# Patient Record
Sex: Female | Born: 1960 | Race: Black or African American | Hispanic: No | Smoking: Former smoker
Health system: Southern US, Community
[De-identification: ages and names within clinical notes are randomized; demographics above are authoritative.]

## PROBLEM LIST (undated history)

## (undated) DIAGNOSIS — I10 Essential (primary) hypertension: Secondary | ICD-10-CM

## (undated) DIAGNOSIS — J45909 Unspecified asthma, uncomplicated: Secondary | ICD-10-CM

## (undated) DIAGNOSIS — H269 Unspecified cataract: Secondary | ICD-10-CM

## (undated) HISTORY — PX: APPENDECTOMY: SHX54

## (undated) HISTORY — DX: Unspecified cataract: H26.9

## (undated) HISTORY — PX: CATARACT EXTRACTION: SUR2

## (undated) HISTORY — DX: Essential (primary) hypertension: I10

---

## 2011-07-16 DIAGNOSIS — R42 Dizziness and giddiness: Secondary | ICD-10-CM | POA: Insufficient documentation

## 2011-07-16 DIAGNOSIS — R200 Anesthesia of skin: Secondary | ICD-10-CM | POA: Insufficient documentation

## 2011-07-16 DIAGNOSIS — R519 Headache, unspecified: Secondary | ICD-10-CM | POA: Insufficient documentation

## 2011-07-16 DIAGNOSIS — R531 Weakness: Secondary | ICD-10-CM | POA: Insufficient documentation

## 2011-07-16 DIAGNOSIS — R61 Generalized hyperhidrosis: Secondary | ICD-10-CM | POA: Insufficient documentation

## 2015-06-18 DIAGNOSIS — I10 Essential (primary) hypertension: Secondary | ICD-10-CM | POA: Insufficient documentation

## 2015-10-03 DIAGNOSIS — R748 Abnormal levels of other serum enzymes: Secondary | ICD-10-CM | POA: Insufficient documentation

## 2015-10-03 DIAGNOSIS — M67919 Unspecified disorder of synovium and tendon, unspecified shoulder: Secondary | ICD-10-CM | POA: Insufficient documentation

## 2015-10-03 DIAGNOSIS — M159 Polyosteoarthritis, unspecified: Secondary | ICD-10-CM | POA: Insufficient documentation

## 2015-10-29 DIAGNOSIS — J069 Acute upper respiratory infection, unspecified: Secondary | ICD-10-CM | POA: Insufficient documentation

## 2016-10-07 ENCOUNTER — Encounter (HOSPITAL_COMMUNITY): Payer: Self-pay | Admitting: Nurse Practitioner

## 2016-10-07 ENCOUNTER — Emergency Department (HOSPITAL_COMMUNITY)
Admission: EM | Admit: 2016-10-07 | Discharge: 2016-10-07 | Disposition: A | Discharge status: Home / Self Care | Payer: Self-pay | Attending: Emergency Medicine | Admitting: Emergency Medicine

## 2016-10-07 DIAGNOSIS — Y33XXXA Other specified events, undetermined intent, initial encounter: Secondary | ICD-10-CM | POA: Insufficient documentation

## 2016-10-07 DIAGNOSIS — Y998 Other external cause status: Secondary | ICD-10-CM | POA: Insufficient documentation

## 2016-10-07 DIAGNOSIS — S7011XA Contusion of right thigh, initial encounter: Secondary | ICD-10-CM | POA: Insufficient documentation

## 2016-10-07 DIAGNOSIS — T148XXA Other injury of unspecified body region, initial encounter: Secondary | ICD-10-CM

## 2016-10-07 DIAGNOSIS — S7012XA Contusion of left thigh, initial encounter: Secondary | ICD-10-CM | POA: Insufficient documentation

## 2016-10-07 DIAGNOSIS — Y929 Unspecified place or not applicable: Secondary | ICD-10-CM | POA: Insufficient documentation

## 2016-10-07 DIAGNOSIS — F17200 Nicotine dependence, unspecified, uncomplicated: Secondary | ICD-10-CM | POA: Insufficient documentation

## 2016-10-07 DIAGNOSIS — Y939 Activity, unspecified: Secondary | ICD-10-CM | POA: Insufficient documentation

## 2016-10-07 LAB — CBC WITH DIFFERENTIAL/PLATELET
Basophils Absolute: 0 10*3/uL (ref 0.0–0.1)
Basophils Relative: 0 %
Eosinophils Absolute: 0.1 10*3/uL (ref 0.0–0.7)
Eosinophils Relative: 1 %
HCT: 43.8 % (ref 36.0–46.0)
Hemoglobin: 14.6 g/dL (ref 12.0–15.0)
Lymphocytes Relative: 34 %
Lymphs Abs: 2.6 10*3/uL (ref 0.7–4.0)
MCH: 33 pg (ref 26.0–34.0)
MCHC: 33.3 g/dL (ref 30.0–36.0)
MCV: 98.9 fL (ref 78.0–100.0)
Monocytes Absolute: 0.4 10*3/uL (ref 0.1–1.0)
Monocytes Relative: 6 %
Neutro Abs: 4.5 10*3/uL (ref 1.7–7.7)
Neutrophils Relative %: 59 %
Platelets: 208 10*3/uL (ref 150–400)
RBC: 4.43 MIL/uL (ref 3.87–5.11)
RDW: 13.2 % (ref 11.5–15.5)
WBC: 7.6 10*3/uL (ref 4.0–10.5)

## 2016-10-07 LAB — COMPREHENSIVE METABOLIC PANEL
ALT: 13 U/L — ABNORMAL LOW (ref 14–54)
AST: 18 U/L (ref 15–41)
Albumin: 4.2 g/dL (ref 3.5–5.0)
Alkaline Phosphatase: 90 U/L (ref 38–126)
Anion gap: 8 (ref 5–15)
BUN: 11 mg/dL (ref 6–20)
CO2: 25 mmol/L (ref 22–32)
Calcium: 9.5 mg/dL (ref 8.9–10.3)
Chloride: 109 mmol/L (ref 101–111)
Creatinine, Ser: 1.05 mg/dL — ABNORMAL HIGH (ref 0.44–1.00)
GFR calc Af Amer: >60 mL/min (ref >60)
GFR calc non Af Amer: 59 mL/min — ABNORMAL LOW (ref >60)
Glucose, Bld: 95 mg/dL (ref 65–99)
Potassium: 3.9 mmol/L (ref 3.5–5.1)
Sodium: 142 mmol/L (ref 135–145)
Total Bilirubin: 0.8 mg/dL (ref 0.3–1.2)
Total Protein: 7.1 g/dL (ref 6.5–8.1)

## 2016-10-07 LAB — PROTIME-INR
INR: 0.97
Prothrombin Time: 12.8 seconds (ref 11.4–15.2)

## 2016-10-07 NOTE — ED Provider Notes (Signed)
[facility]-EMER[facility]ENCY DEPT Provider Note   CSN: [id number] Arrival [id number] & time: 10/07/16  [id number]     History   Chief Complaint No chief complaint on file.   HPI Audrey Montgomery is a 56 y.o. female.  HPI   56 year old female presents today with complaints of bruising to her bilateral lower extremities.  Patient notes over the last year she has had intermittent bruising different areas of her body.  She notes most recently several weeks ago she had bruising over her proximal thighs.  She notes these bruises have improved and only has minor discomfort and bruising presently.  Patient reports intermittent swelling of her lower extremities bilateral, none presently.  She denies any fever, weight loss, systemic symptoms.  Patient has not sought evaluation for this previously.  Patient recently moved to the area and does not have a primary care provider.  Patient denies any other bruising or bleeding, denies any bleeding disorders.  She does not take any antiplatelets, anticoagulants.  Occasional alcohol use.  Patient denies any chronic health conditions or any prescription medications.   History reviewed. No pertinent past medical history.  There are no active problems to display for this patient.   Past Surgical History:  Procedure Laterality Date  . APPENDECTOMY      OB History    No data available       Home Medications    Prior to Admission medications   Not on File    Family History No family history on file.  Social History Social History  Substance Use Topics  . Smoking status: Current Every Day Smoker    Packs/day: 0.50  . Smokeless tobacco: Never Used  . Alcohol use Yes     Allergies   Patient has no known allergies.   Review of Systems Review of Systems  All other systems reviewed and are negative.    Physical Exam Updated Vital Signs BP (!) 154/105   Pulse 70   Temp 98 F (36.7 C)   Resp 18   Ht 5\' 7"  (1.702 m)   Wt 81.6 kg (180 lb)   LMP  (LMP  Unknown)   SpO2 100%   BMI 28.19 kg/m   Physical Exam  Constitutional: She is oriented to person, place, and time. She appears well-developed and well-nourished.  HENT:  Head: Normocephalic and atraumatic.  Eyes: Pupils are equal, round, and reactive to light. Conjunctivae are normal. Right eye exhibits no discharge. Left eye exhibits no discharge. No scleral icterus.  Neck: Normal range of motion. No JVD present. No tracheal deviation present.  Pulmonary/Chest: Effort normal. No stridor.  Musculoskeletal:  Faint bruising to the bilateral upper thighs-minimally tender, bilateral lower extremities without swelling, redness or warmth to touch, distal perfusion is intact  Neurological: She is alert and oriented to person, place, and time. Coordination normal.  Psychiatric: She has a normal mood and affect. Her behavior is normal. Judgment and thought content normal.  Nursing note and vitals reviewed.    ED Treatments / Results  Labs (all labs ordered are listed, but only abnormal results are displayed) Labs Reviewed  COMPREHENSIVE METABOLIC PANEL - Abnormal; Notable for the following:       Result Value   Creatinine, Ser 1.05 (*)    ALT 13 (*)    GFR calc non Af Amer 59 (*)    All other components within normal limits  CBC WITH DIFFERENTIAL/PLATELET  PROTIME-INR    EKG  EKG Interpretation None  Radiology No results found.  Procedures Procedures (including critical care time)  Medications Ordered in ED Medications - No data to display   Initial Impression / Assessment and Plan / ED Course  I have reviewed the triage vital signs and the nursing notes.  Pertinent labs & imaging results that were available during my care of the patient were reviewed by me and considered in my medical decision making (see chart for details).      Final Clinical Impressions(s) / ED Diagnoses   Final diagnoses:  Bruising    56 year old female presents today with complaints  of bruising.  This appears to be localized on the proximal thighs.  I suspect this is likely secondary to light trauma and unlikely systemic reason.  Basic labs were performed here with no significant abnormalities.  Patient has no acute life-threatening etiology here, she will follow-up as an outpatient with her primary care, strict return precautions given.  She verbalized understanding and agreement to today's plan.  New Prescriptions New Prescriptions   No medications on file     Rosalio Loud 10/07/16 1334    Vanetta Mulders, MD 10/07/16 1714

## 2016-10-07 NOTE — ED Triage Notes (Signed)
Pt endorses bilateral leg pain intermittent ongoing for 2 weeks. Pt also notes discoloration on her legs that appeared to be bruised 2 weeks ago but has decreased since. Pt denies taking blood thinners, injury or falls. Pt smokes 1/2 pack daily, denies long travels, does not take bc

## 2016-10-07 NOTE — Discharge Instructions (Signed)
Please read attached information. If you experience any new or worsening signs or symptoms please return to the emergency room for evaluation. Please follow-up with your primary care provider or specialist as discussed.  °

## 2017-08-11 ENCOUNTER — Encounter (HOSPITAL_COMMUNITY): Payer: Self-pay | Admitting: Emergency Medicine

## 2017-08-11 ENCOUNTER — Other Ambulatory Visit: Payer: Self-pay

## 2017-08-11 ENCOUNTER — Emergency Department (HOSPITAL_COMMUNITY)
Admission: EM | Admit: 2017-08-11 | Discharge: 2017-08-11 | Disposition: A | Discharge status: Home / Self Care | Payer: Managed Care, Other (non HMO) | Attending: Emergency Medicine | Admitting: Emergency Medicine

## 2017-08-11 ENCOUNTER — Emergency Department (HOSPITAL_COMMUNITY): Payer: Managed Care, Other (non HMO)

## 2017-08-11 DIAGNOSIS — F17200 Nicotine dependence, unspecified, uncomplicated: Secondary | ICD-10-CM | POA: Diagnosis not present

## 2017-08-11 DIAGNOSIS — N12 Tubulo-interstitial nephritis, not specified as acute or chronic: Secondary | ICD-10-CM | POA: Diagnosis not present

## 2017-08-11 DIAGNOSIS — R509 Fever, unspecified: Secondary | ICD-10-CM | POA: Diagnosis present

## 2017-08-11 LAB — COMPREHENSIVE METABOLIC PANEL
ALBUMIN: 2.9 g/dL — AB (ref 3.5–5.0)
ALK PHOS: 164 U/L — AB (ref 38–126)
ALT: 62 U/L — ABNORMAL HIGH (ref 0–44)
AST: 51 U/L — AB (ref 15–41)
Anion gap: 8 (ref 5–15)
BILIRUBIN TOTAL: 0.8 mg/dL (ref 0.3–1.2)
BUN: 7 mg/dL (ref 6–20)
CALCIUM: 8.4 mg/dL — AB (ref 8.9–10.3)
CO2: 24 mmol/L (ref 22–32)
Chloride: 104 mmol/L (ref 98–111)
Creatinine, Ser: 1.16 mg/dL — ABNORMAL HIGH (ref 0.44–1.00)
GFR calc Af Amer: 60 mL/min — ABNORMAL LOW (ref >60)
GFR calc non Af Amer: 52 mL/min — ABNORMAL LOW (ref >60)
GLUCOSE: 128 mg/dL — AB (ref 70–99)
POTASSIUM: 3.3 mmol/L — AB (ref 3.5–5.1)
Sodium: 136 mmol/L (ref 135–145)
TOTAL PROTEIN: 6.5 g/dL (ref 6.5–8.1)

## 2017-08-11 LAB — CBC WITH DIFFERENTIAL/PLATELET
Abs Immature Granulocytes: 0.1 10*3/uL (ref 0.0–0.1)
Basophils Absolute: 0 10*3/uL (ref 0.0–0.1)
Basophils Relative: 0 %
Eosinophils Absolute: 0 10*3/uL (ref 0.0–0.7)
Eosinophils Relative: 0 %
HEMATOCRIT: 35.8 % — AB (ref 36.0–46.0)
HEMOGLOBIN: 11.6 g/dL — AB (ref 12.0–15.0)
IMMATURE GRANULOCYTES: 1 %
LYMPHS ABS: 2.2 10*3/uL (ref 0.7–4.0)
LYMPHS PCT: 18 %
MCH: 31.4 pg (ref 26.0–34.0)
MCHC: 32.4 g/dL (ref 30.0–36.0)
MCV: 97 fL (ref 78.0–100.0)
Monocytes Absolute: 1.6 10*3/uL — ABNORMAL HIGH (ref 0.1–1.0)
Monocytes Relative: 13 %
NEUTROS PCT: 68 %
Neutro Abs: 8.4 10*3/uL — ABNORMAL HIGH (ref 1.7–7.7)
Platelets: 272 10*3/uL (ref 150–400)
RBC: 3.69 MIL/uL — AB (ref 3.87–5.11)
RDW: 13 % (ref 11.5–15.5)
WBC: 12.3 10*3/uL — AB (ref 4.0–10.5)

## 2017-08-11 LAB — URINALYSIS, ROUTINE W REFLEX MICROSCOPIC
BILIRUBIN URINE: NEGATIVE
GLUCOSE, UA: 150 mg/dL — AB
Ketones, ur: NEGATIVE mg/dL
Nitrite: POSITIVE — AB
PH: 6 (ref 5.0–8.0)
Protein, ur: 30 mg/dL — AB
SPECIFIC GRAVITY, URINE: 1.011 (ref 1.005–1.030)
WBC, UA: >50 WBC/hpf — ABNORMAL HIGH (ref 0–5)

## 2017-08-11 LAB — I-STAT CG4 LACTIC ACID, ED: Lactic Acid, Venous: 1.36 mmol/L (ref 0.5–1.9)

## 2017-08-11 MED ORDER — ACETAMINOPHEN 325 MG PO TABS
650.0000 mg | ORAL_TABLET | Freq: Once | ORAL | Status: AC
  Administered 2017-08-11: 650 mg via ORAL
  Filled 2017-08-11: qty 2

## 2017-08-11 MED ORDER — SODIUM CHLORIDE 0.9 % IV SOLN
1.0000 g | Freq: Once | INTRAVENOUS | Status: AC
  Administered 2017-08-11: 1 g via INTRAVENOUS
  Filled 2017-08-11: qty 10

## 2017-08-11 MED ORDER — CEPHALEXIN 500 MG PO CAPS: 500.0000 mg | ORAL_CAPSULE | Freq: Three times a day (TID) | ORAL | 0 refills | Status: DC

## 2017-08-11 NOTE — ED Triage Notes (Signed)
Per pt, pt coming from home with complaints of running fevers for two days. Pt complains of cold sweats and being constatly tired. Pt denies N/V/D or any URI symptoms. Temp 100.9 F

## 2017-08-11 NOTE — ED Provider Notes (Signed)
[facility] EMERGENCY [facility] Provider Note   CSN: [id number] Arrival date & time: 08/11/17  0431     History   Chief Complaint Chief Complaint  Patient presents with  . Fever    HPI Audrey Montgomery is a 57 y.o. female.  HPI  This is a 57 year old female who presents with fever.  Patient reports 2-day history of running fevers at home.  Reports oral temperatures at home as high as 107.5.  She reports taking Tylenol with some improvement but fever returns.  She reports myalgias and cold sweats.  Denies any cough, chest pain, shortness of breath, nausea, vomiting, diarrhea.  She denies any upper respiratory symptoms including sore throat, ear pain.  She denies rash or dysuria.  She denies any other infectious symptoms.  She states "I just do not feel well."  Does report some headache.  Denies neck pain or stiffness.  Denies any tick exposures.  History reviewed. No pertinent past medical history.  There are no active problems to display for this patient.   Past Surgical History:  Procedure Laterality Date  . APPENDECTOMY       OB History   None      Home Medications    Prior to Admission medications   Medication Sig Start Date End Date Taking? Authorizing Provider  cephALEXin (KEFLEX) 500 MG capsule Take 1 capsule (500 mg total) by mouth 3 (three) times daily. 08/11/17   Kimberlie Csaszar, Mayer Maskerourtney F, MD    Family History History reviewed. No pertinent family history.  Social History Social History   Tobacco Use  . Smoking status: Current Every Day Smoker    Packs/day: 0.50  . Smokeless tobacco: Never Used  Substance Use Topics  . Alcohol use: Yes  . Drug use: No     Allergies   Patient has no known allergies.   Review of Systems Review of Systems  Constitutional: Positive for chills and fever.  HENT: Negative for congestion and sore throat.   Respiratory: Negative for cough and shortness of breath.   Cardiovascular: Negative for chest  pain.  Gastrointestinal: Negative for abdominal pain, diarrhea, nausea and vomiting.  Genitourinary: Negative for dysuria.  Musculoskeletal: Negative for back pain, neck pain and neck stiffness.  Skin: Negative for rash.  Neurological: Negative for weakness and headaches.  All other systems reviewed and are negative.    Physical Exam Updated Vital Signs BP 113/74   Pulse 79   Temp 100.3 F (37.9 C) (Oral)   Ht 5\' 8"  (1.727 m)   Wt 70.3 kg (155 lb)   SpO2 96%   BMI 23.57 kg/m   Physical Exam  Constitutional: She is oriented to person, place, and time. She appears well-developed and well-nourished. She appears distressed.  HENT:  Head: Normocephalic and atraumatic.  Mouth/Throat: Oropharynx is clear and moist. No oropharyngeal exudate.  Eyes: Pupils are equal, round, and reactive to light.  Neck: Normal range of motion. Neck supple.  No meningismus  Cardiovascular: Normal rate, regular rhythm and normal heart sounds.  Pulmonary/Chest: Effort normal and breath sounds normal. No respiratory distress. She has no wheezes.  Abdominal: Soft. Bowel sounds are normal. There is no tenderness.  Musculoskeletal: She exhibits no edema or deformity.  Neurological: She is alert and oriented to person, place, and time.  Skin: Skin is warm and dry. No rash noted.  Psychiatric: She has a normal mood and affect.  Nursing note and vitals reviewed.    ED Treatments / Results  Labs (all  labs ordered are listed, but only abnormal results are displayed) Labs Reviewed  CBC WITH DIFFERENTIAL/PLATELET - Abnormal; Notable for the following components:      Result Value   WBC 12.3 (*)    RBC 3.69 (*)    Hemoglobin 11.6 (*)    HCT 35.8 (*)    Neutro Abs 8.4 (*)    Monocytes Absolute 1.6 (*)    All other components within normal limits  COMPREHENSIVE METABOLIC PANEL - Abnormal; Notable for the following components:   Potassium 3.3 (*)    Glucose, Bld 128 (*)    Creatinine, Ser 1.16 (*)     Calcium 8.4 (*)    Albumin 2.9 (*)    AST 51 (*)    ALT 62 (*)    Alkaline Phosphatase 164 (*)    GFR calc non Af Amer 52 (*)    GFR calc Af Amer 60 (*)    All other components within normal limits  URINALYSIS, ROUTINE W REFLEX MICROSCOPIC - Abnormal; Notable for the following components:   APPearance HAZY (*)    Glucose, UA 150 (*)    Hgb urine dipstick MODERATE (*)    Protein, ur 30 (*)    Nitrite POSITIVE (*)    Leukocytes, UA MODERATE (*)    WBC, UA >50 (*)    Bacteria, UA MANY (*)    All other components within normal limits  URINE CULTURE  I-STAT CG4 LACTIC ACID, ED  I-STAT CG4 LACTIC ACID, ED    EKG None  Radiology Dg Chest 2 View  Result Date: 08/11/2017 CLINICAL DATA:  Fever for 2 days. EXAM: CHEST - 2 VIEW COMPARISON:  None. FINDINGS: The heart size and mediastinal contours are within normal limits. Both lungs are clear. The visualized skeletal structures are unremarkable. IMPRESSION: No active cardiopulmonary disease. Electronically Signed   By: Burman Nieves M.D.   On: 08/11/2017 05:43    Procedures Procedures (including critical care time)  Medications Ordered in ED Medications  acetaminophen (TYLENOL) tablet 650 mg (650 mg Oral Given 08/11/17 0631)  cefTRIAXone (ROCEPHIN) 1 g in sodium chloride 0.9 % 100 mL IVPB (0 g Intravenous Stopped 08/11/17 0702)     Initial Impression / Assessment and Plan / ED Course  I have reviewed the triage vital signs and the nursing notes.  Pertinent labs & imaging results that were available during my care of the patient were reviewed by me and considered in my medical decision making (see chart for details).  Clinical Course as of Aug 12 703  Wed Aug 11, 2017  1610 Patient feels better after Tylenol and antibiotics.  She is overall nontoxic-appearing.  Vital signs remain reassuring.  Urine culture sent.   [CH]    Clinical Course User Index [CH] Davyn Morandi, Mayer Masker, MD    Patient presents with fever.  Denies any other  significant symptoms.  Does report myalgias.  She is overall nontoxic-appearing.  Initial temperature 100.9.  Mildly tachycardic.  Lactate is normal.  She systemically does not appear ill-appearing.  She is a mild leukocytosis.  Chest x-ray is negative.  Urinalysis is nitrite positive with greater than 50 white cells and many bacteria.  Suspect this is likely the source.  Given her fever, this is more likely an upper urinary tract infection.  On recheck, patient states she feels much better after Tylenol and antibiotics.  Feel she is an appropriate candidate for outpatient therapy.  She is tolerating fluids without difficulty.  Will discharge home with  Keflex 3 times daily.  She was given strict return precautions.  After history, exam, and medical workup I feel the patient has been appropriately medically screened and is safe for discharge home. Pertinent diagnoses were discussed with the patient. Patient was given return precautions.   Final Clinical Impressions(s) / ED Diagnoses   Final diagnoses:  Pyelonephritis    ED Discharge Orders        Ordered    cephALEXin (KEFLEX) 500 MG capsule  3 times daily     08/11/17 0705       Crystalee Ventress, Mayer Masker, MD 08/11/17 [phone]

## 2017-08-11 NOTE — Discharge Instructions (Addendum)
You were seen today for fever.  You were found to have a urinary tract infection.  Take antibiotics as prescribed.  If you have persistent fevers, develop back pain or any worsening symptoms you should be reevaluated immediately.  Make sure to stay hydrated.

## 2017-08-13 LAB — URINE CULTURE: Culture: >=100000 — AB

## 2017-08-14 ENCOUNTER — Telehealth: Payer: Self-pay

## 2017-08-14 NOTE — Telephone Encounter (Signed)
Post ED Visit - Positive Culture Follow-up: Successful Patient Follow-Up  Culture assessed and recommendations reviewed by:  []  Enzo BiNathan Batchelder, Pharm.D. []  Celedonio MiyamotoJeremy Frens, Pharm.D., BCPS AQ-ID [x]  Garvin FilaMike Maccia, Pharm.D., BCPS []  Georgina PillionElizabeth Martin, Pharm.D., BCPS []  AmherstMinh Pham, Vermont[place]arm.D., BCPS, AAHIVP []  Estella HuskMichelle Turner, Pharm.D., BCPS, AAHIVP []  Lysle Pearlachel Rumbarger, PharmD, BCPS []  Phillips Climeshuy Dang, PharmD, BCPS []  Agapito GamesAlison Masters, PharmD, BCPS []  Verlan FriendsErin Deja, PharmD  Positive urine culture  []  Patient discharged without antimicrobial prescription and treatment is now indicated [x]  Organism is resistant to prescribed ED discharge antimicrobial []  Patient with positive blood cultures  Changes discussed with ED provider: Shirlyn GoltzEmily Shrosbree PA-C New antibiotic prescription Bactrim DS 1 BID x 7 days [facility] [phone]  Contacted patient, date 08/14/17, time 1009   Marriana Hibberd Doristine CounterBurnett 08/14/2017, 10:05 AM

## 2017-08-14 NOTE — Progress Notes (Signed)
ED Antimicrobial Stewardship Positive Culture Follow Up   Audrey Montgomery is an 57 y.o. female who presented to [facility] 08/11/2017 with a chief complaint of  Chief Complaint  Patient presents with  . Fever    Recent Results (from the past 720 hour(s))  Urine culture     Status: Abnormal   Collection Time: 08/11/17  6:13 AM  Result Value Ref Range Status   Specimen Description URINE, RANDOM  Final   Special Requests NONE  Final   Culture (A)  Final    >=100,000 COLONIES/mL ESCHERICHIA COLI Confirmed Extended Spectrum Beta-Lactamase Producer (ESBL).  In bloodstream infections from ESBL organisms, carbapenems are preferred over piperacillin/tazobactam. They are shown to have a lower risk of mortality. Performed at [facility], [address] [place], [place] [address]    Report Status 08/13/2017 FINAL  Final   Organism ID, Bacteria ESCHERICHIA COLI (A)  Final      Susceptibility   Escherichia coli - MIC*    AMPICILLIN >=32 RESISTANT Resistant     CEFAZOLIN >=64 RESISTANT Resistant     CEFTRIAXONE >=64 RESISTANT Resistant     CIPROFLOXACIN >=4 RESISTANT Resistant     GENTAMICIN <=1 SENSITIVE Sensitive     IMIPENEM <=0.25 SENSITIVE Sensitive     NITROFURANTOIN <=16 SENSITIVE Sensitive     TRIMETH/SULFA <=20 SENSITIVE Sensitive     AMPICILLIN/SULBACTAM 16 INTERMEDIATE Intermediate     PIP/TAZO <=4 SENSITIVE Sensitive     Extended ESBL POSITIVE Resistant     * >=100,000 COLONIES/mL ESCHERICHIA COLI    [x]  Treated with cephalexin, organism resistant to prescribed antimicrobial []  Patient discharged originally without antimicrobial agent and treatment is now indicated  New antibiotic prescription: bactrim ds 1 bid x 1 week  ED Provider: Shirlyn GoltzEmily Shrosbree, PA-C  Isaac BlissMichael Bena Kobel, PharmD, BCPS, BCCCP Clinical Pharmacist [phone]  Please check AMION for all [facility] numbers  08/14/2017 9:06 AM

## 2017-11-23 ENCOUNTER — Ambulatory Visit: Payer: Managed Care, Other (non HMO) | Attending: Nurse Practitioner | Admitting: Nurse Practitioner

## 2017-11-23 ENCOUNTER — Encounter: Payer: Self-pay | Admitting: Nurse Practitioner

## 2017-11-23 VITALS — BP 157/104 | HR 68 | Temp 98.7°F | Ht 70.0 in | Wt 163.6 lb

## 2017-11-23 DIAGNOSIS — Z1211 Encounter for screening for malignant neoplasm of colon: Secondary | ICD-10-CM | POA: Diagnosis not present

## 2017-11-23 DIAGNOSIS — F17200 Nicotine dependence, unspecified, uncomplicated: Secondary | ICD-10-CM | POA: Diagnosis not present

## 2017-11-23 DIAGNOSIS — Z1231 Encounter for screening mammogram for malignant neoplasm of breast: Secondary | ICD-10-CM | POA: Diagnosis not present

## 2017-11-23 DIAGNOSIS — I10 Essential (primary) hypertension: Secondary | ICD-10-CM

## 2017-11-23 DIAGNOSIS — M79606 Pain in leg, unspecified: Secondary | ICD-10-CM | POA: Insufficient documentation

## 2017-11-23 DIAGNOSIS — F17210 Nicotine dependence, cigarettes, uncomplicated: Secondary | ICD-10-CM | POA: Insufficient documentation

## 2017-11-23 DIAGNOSIS — Z716 Tobacco abuse counseling: Secondary | ICD-10-CM | POA: Insufficient documentation

## 2017-11-23 DIAGNOSIS — Z0000 Encounter for general adult medical examination without abnormal findings: Secondary | ICD-10-CM | POA: Insufficient documentation

## 2017-11-23 DIAGNOSIS — R51 Headache: Secondary | ICD-10-CM | POA: Insufficient documentation

## 2017-11-23 MED ORDER — VARENICLINE TARTRATE 0.5 MG X 11 & 1 MG X 42 PO MISC
ORAL | 0 refills | Status: DC
Start: 2017-11-23 — End: 2017-12-30

## 2017-11-23 MED ORDER — CLONIDINE HCL 0.1 MG PO TABS
0.2000 mg | ORAL_TABLET | Freq: Once | ORAL | Status: AC
  Administered 2017-11-23: 0.2 mg via ORAL

## 2017-11-23 MED ORDER — AMLODIPINE BESYLATE 5 MG PO TABS: 5.0000 mg | ORAL_TABLET | Freq: Every day | ORAL | 3 refills | Status: DC

## 2017-11-23 MED FILL — AMLODIPINE BESYLATE 5 MG TA: 5 | 30 days supply | Qty: 30 | Fill #0

## 2017-11-23 NOTE — Progress Notes (Signed)
Assessment & Plan:  Audrey Montgomery was seen today for establish care.  Diagnoses and all orders for this visit:  Essential hypertension -     cloNIDine (CATAPRES) tablet 0.2 mg -     amLODipine (NORVASC) 5 MG tablet; Take 1 tablet (5 mg total) by mouth daily. -     CBC -     CMP14+EGFR Continue all antihypertensives as prescribed.  Remember to bring in your blood pressure log with you for your follow up appointment.  DASH/Mediterranean Diets are healthier choices for HTN.   Tobacco dependence -     varenicline (CHANTIX STARTING MONTH PAK) 0.5 MG X 11 & 1 MG X 42 tablet; Take one 0.5 mg tablet by mouth daily x 3 days,  increase to one 0.5 mg tablet twice daily x 4 days,  increase to one 1 mg tablet 2x daily Audrey Montgomery was counseled on the dangers of tobacco use, and was advised to quit. Reviewed strategies to maximize success, including removing cigarettes and smoking materials from environment, stress management and support of family/friends as well as pharmacological alternatives including: Wellbutrin, Chantix, Nicotine patch, Nicotine gum or lozenges. Smoking cessation support: smoking cessation hotline: 1-800-QUIT-NOW.  Smoking cessation classes are also available through [facility]. Call ([phone] or visit our website at [email or web address]   A total of 5 minutes was spent on counseling for smoking cessation and Audrey Montgomery is ready to quit.   Routine health maintenance -     Hepatitis C Antibody  Breast cancer screening by mammogram -     MM 3D SCREEN BREAST BILATERAL; Future  Colon cancer screening -     Fecal occult blood, imunochemical([facility])    Patient has been counseled on age-appropriate routine health concerns for screening and prevention. These are reviewed and up-to-date. Referrals have been placed accordingly. Immunizations are up-to-date or declined.    Subjective:   Chief Complaint  Patient presents with  . Establish Care   Pt. is here to establish care. Pt. stated she have headaches and leg pain.    HPI Audrey Montgomery 57 y.o. female presents to office today to establish care as a new patient.  Her blood pressure is elevated today. She required clonidine here in the office today.     Essential Hypertension Started on amlodipine 5 mg today. She denies any previous history of HTN.  Denies chest pain, shortness of breath, palpitations, lightheadedness, dizziness, headaches or BLE edema.  BP Readings from Last 3 Encounters:  11/23/17 (!) 157/104  08/11/17 113/74  10/07/16 (!) 154/105    Tobacco Dependence She is ready to quit smoking. Would like to try chantix. Currently smoking 1/2 PPD.      Review of Systems  Constitutional: Negative for fever, malaise/fatigue and weight loss.  HENT: Negative.  Negative for nosebleeds.   Eyes: Positive for blurred vision. Negative for double vision and photophobia.  Respiratory: Negative.  Negative for cough and shortness of breath.   Cardiovascular: Negative.  Negative for chest pain, palpitations and leg swelling.  Gastrointestinal: Negative.  Negative for heartburn, nausea and vomiting.  Musculoskeletal: Negative.  Negative for myalgias.  Neurological: Negative.  Negative for dizziness, focal weakness, seizures and headaches.  Psychiatric/Behavioral: Negative.  Negative for suicidal ideas.    Past Medical History:  Diagnosis Date  . Cataract    Both Eyes.  . Hypertension     Past Surgical History:  Procedure Laterality Date  . APPENDECTOMY      Family History  Problem Relation Age  of Onset  . Stroke Brother     Social History Reviewed with no changes to be made today.   Outpatient Medications Prior to Visit  Medication Sig Dispense Refill  . cephALEXin (KEFLEX) 500 MG capsule Take 1 capsule (500 mg total) by mouth 3 (three) times daily. (Patient not taking: Reported on 11/23/2017) 21 capsule 0   No facility-administered medications prior to  visit.     No Known Allergies     Objective:    BP (!) 157/104 (BP Location: Right Arm, Patient Position: Sitting, Cuff Size: Normal)   Pulse 68   Temp 98.7 F (37.1 C) (Oral)   Ht '5\' 10"'  (1.778 m)   Wt 163 lb 9.6 oz (74.2 kg)   LMP  (LMP Unknown)   SpO2 100%   BMI 23.47 kg/m  Wt Readings from Last 3 Encounters:  11/23/17 163 lb 9.6 oz (74.2 kg)  08/11/17 155 lb (70.3 kg)  10/07/16 180 lb (81.6 kg)    Physical Exam  Constitutional: She is oriented to person, place, and time. She appears well-developed and well-nourished. She is cooperative.  HENT:  Head: Normocephalic and atraumatic.  Eyes: EOM are normal.  Neck: Normal range of motion.  Cardiovascular: Normal rate, regular rhythm and normal heart sounds. Exam reveals no gallop and no friction rub.  No murmur heard. Pulmonary/Chest: Effort normal and breath sounds normal. No tachypnea. No respiratory distress. She has no decreased breath sounds. She has no wheezes. She has no rhonchi. She has no rales. She exhibits no tenderness.  Abdominal: Bowel sounds are normal.  Musculoskeletal: Normal range of motion. She exhibits no edema.  Neurological: She is alert and oriented to person, place, and time. Coordination normal.  Skin: Skin is warm and dry.  Psychiatric: She has a normal mood and affect. Her behavior is normal. Judgment and thought content normal.  Nursing note and vitals reviewed.      Patient has been counseled extensively about nutrition and exercise as well as the importance of adherence with medications and regular follow-up. The patient was given clear instructions to go to ER or return to medical center if symptoms don't improve, worsen or new problems develop. The patient verbalized understanding.   Follow-up: Return in about 3 weeks (around 12/14/2017) for BP recheck with luke and schedule ABI with nubea same day. See me in 2 months.   [name], FNP-BC [facility] [place], [place]   11/24/2017, 11:36 PM

## 2017-11-24 ENCOUNTER — Encounter: Payer: Self-pay | Admitting: Nurse Practitioner

## 2017-11-24 LAB — CBC
Hematocrit: 43.8 % (ref 34.0–46.6)
Hemoglobin: 14.7 g/dL (ref 11.1–15.9)
MCH: 32.6 pg (ref 26.6–33.0)
MCHC: 33.6 g/dL (ref 31.5–35.7)
MCV: 97 fL (ref 79–97)
PLATELETS: 224 10*3/uL (ref 150–450)
RBC: 4.51 x10E6/uL (ref 3.77–5.28)
RDW: 12 % — ABNORMAL LOW (ref 12.3–15.4)
WBC: 7.2 10*3/uL (ref 3.4–10.8)

## 2017-11-24 LAB — CMP14+EGFR
A/G RATIO: 1.8 (ref 1.2–2.2)
ALT: 12 IU/L (ref 0–32)
AST: 19 IU/L (ref 0–40)
Albumin: 4.4 g/dL (ref 3.5–5.5)
Alkaline Phosphatase: 88 IU/L (ref 39–117)
BUN/Creatinine Ratio: 12 (ref 9–23)
BUN: 13 mg/dL (ref 6–24)
Bilirubin Total: 0.4 mg/dL (ref 0.0–1.2)
CALCIUM: 9.8 mg/dL (ref 8.7–10.2)
CO2: 24 mmol/L (ref 20–29)
CREATININE: 1.06 mg/dL — AB (ref 0.57–1.00)
Chloride: 104 mmol/L (ref 96–106)
GFR calc Af Amer: 67 mL/min/{1.73_m2} (ref >59)
GFR, EST NON AFRICAN AMERICAN: 58 mL/min/{1.73_m2} — AB (ref >59)
GLUCOSE: 81 mg/dL (ref 65–99)
Globulin, Total: 2.5 g/dL (ref 1.5–4.5)
Potassium: 4.2 mmol/L (ref 3.5–5.2)
Sodium: 142 mmol/L (ref 134–144)
TOTAL PROTEIN: 6.9 g/dL (ref 6.0–8.5)

## 2017-11-24 LAB — HEPATITIS C ANTIBODY: Hep C Virus Ab: <0.1 s/co ratio (ref 0.0–0.9)

## 2017-11-24 MED FILL — CHANTIX STARTING MONTH BOX: 0.5 MG X 11 | 30 days supply | Qty: 53 | Fill #0

## 2017-11-25 ENCOUNTER — Telehealth: Payer: Self-pay

## 2017-11-25 NOTE — Telephone Encounter (Signed)
-----   Message from Claiborne Rigg, NP sent at 11/24/2017 11:41 PM EDT ----- HEP C is negative. All of your labs are essentially normal. There are some minor variations in your blood work that do not require any additional work up at this time. Will continue to monitor. Make sure you are drinking at least 48 oz of water per day. Work on eating a low fat, heart healthy diet and participate in regular aerobic exercise program to control as well. Exercise at least  30 minutes per day-5 days per week. Avoid red meat. No fried foods. No junk foods, sodas, sugary foods or drinks, unhealthy snacking, alcohol or smoking.

## 2017-11-25 NOTE — Telephone Encounter (Signed)
CMA attempt to reach patient to inform on results.  No answer and left a VM for patient to call back.  If patient call back, please inform:  HEP C is negative. All of your labs are essentially normal. There are some minor variations in your blood work that do not require any additional work up at this time. Will continue to monitor. Make sure you are drinking at least 48 oz of water per day. Work on eating a low fat, heart healthy diet and participate in regular aerobic exercise program to control as well. Exercise at least  30 minutes per day-5 days per week. Avoid red meat. No fried foods. No junk foods, sodas, sugary foods or drinks, unhealthy snacking, alcohol or smoking.

## 2017-12-02 LAB — FECAL OCCULT BLOOD, IMMUNOCHEMICAL: FECAL OCCULT BLD: NEGATIVE

## 2017-12-07 ENCOUNTER — Ambulatory Visit (INDEPENDENT_AMBULATORY_CARE_PROVIDER_SITE_OTHER): Payer: Managed Care, Other (non HMO) | Admitting: Physician Assistant

## 2017-12-07 ENCOUNTER — Telehealth: Payer: Self-pay

## 2017-12-07 NOTE — Telephone Encounter (Signed)
CMA attempt to reach patient to inform on results.  No answer and unable to leave a VM due to mailbox is full.  If patient call back, please inform:  Stool sample negative for blood

## 2017-12-07 NOTE — Telephone Encounter (Signed)
-----   Message from Claiborne Rigg, NP sent at 12/05/2017  9:25 PM EDT ----- Stool sample negative for blood

## 2017-12-14 ENCOUNTER — Other Ambulatory Visit: Payer: Self-pay | Admitting: Nurse Practitioner

## 2017-12-14 ENCOUNTER — Ambulatory Visit: Payer: Managed Care, Other (non HMO) | Attending: Family Medicine | Admitting: Pharmacist

## 2017-12-14 ENCOUNTER — Ambulatory Visit: Payer: Managed Care, Other (non HMO) | Admitting: *Deleted

## 2017-12-14 ENCOUNTER — Encounter: Payer: Self-pay | Admitting: Pharmacist

## 2017-12-14 VITALS — BP 129/86 | HR 66

## 2017-12-14 DIAGNOSIS — M79604 Pain in right leg: Secondary | ICD-10-CM

## 2017-12-14 DIAGNOSIS — M79605 Pain in left leg: Principal | ICD-10-CM

## 2017-12-14 DIAGNOSIS — Z79899 Other long term (current) drug therapy: Secondary | ICD-10-CM | POA: Insufficient documentation

## 2017-12-14 DIAGNOSIS — I10 Essential (primary) hypertension: Secondary | ICD-10-CM | POA: Insufficient documentation

## 2017-12-14 DIAGNOSIS — Z823 Family history of stroke: Secondary | ICD-10-CM | POA: Insufficient documentation

## 2017-12-14 DIAGNOSIS — F17210 Nicotine dependence, cigarettes, uncomplicated: Secondary | ICD-10-CM | POA: Insufficient documentation

## 2017-12-14 LAB — POCT ABI - SCREENING FOR PILOT NO CHARGE
Immediate ABI left: 1.14
Immediate ABI right: 1.17

## 2017-12-14 MED ORDER — IBUPROFEN 800 MG PO TABS: 800.0000 mg | ORAL_TABLET | Freq: Three times a day (TID) | ORAL | 1 refills | Status: DC | PRN

## 2017-12-14 NOTE — Progress Notes (Signed)
   S:    PCP: Bertram Denver  Patient arrives in good spirits. Presents to the clinic for hypertension management. Patient was referred by Zelda on 11/23/17. Initial BP at that visit was 176/118. Clonidine 0.2 mgx1 administered in clinic. BP decreased to 157/104. Amlodipine 5 mg initiated.   Patient denies CP, SOB, HA or blurred vision.    Patient reports adherence with medications.  Current BP Medications include:   - amlodipine 5 mg  Antihypertensives tried in the past include:  - NKDA - no other medications tried in the past  Dietary habits include:  - Limits salt - 1 cup of coffee/dy Exercise habits include: - On her feet all day at work Family / Social history:  - FH: stroke (brother) - Tobacco: currently on Chantix, reports only smoking 1 cigarette today - Alcohol: occasional  Home BP readings:  - Does not check at home  O:  L arm after 5 minutes rest: 129/86, HR 66  Last 3 Office BP readings: BP Readings from Last 3 Encounters:  11/23/17 (!) 157/104  08/11/17 113/74  10/07/16 (!) 154/105   BMET    Component Value Date/Time   NA 142 11/23/2017 1526   K 4.2 11/23/2017 1526   CL 104 11/23/2017 1526   CO2 24 11/23/2017 1526   GLUCOSE 81 11/23/2017 1526   GLUCOSE 128 (H) 08/11/2017 0522   BUN 13 11/23/2017 1526   CREATININE 1.06 (H) 11/23/2017 1526   CALCIUM 9.8 11/23/2017 1526   GFRNONAA 58 (L) 11/23/2017 1526   GFRAA 67 11/23/2017 1526   Renal function: CrCl cannot be calculated (Unknown ideal weight.).  A/P: Hypertension newly diagnosed currently uncontrolled but improved on current medications. BP Goal <130/80 mmHg. Patient is adherent with amlodipine. She is uninterested in increasing dose at this time. She will follow-up with me in 2 weeks. If she continues to be above goal, she is agreeable to increasing amlodipine at that time. -Continued amlodipine 5 mg. -Counseled on lifestyle modifications for blood pressure control including reduced dietary  sodium, increased exercise, adequate sleep  Results reviewed and written information provided.  Total time in face-to-face counseling 15 minutes.   F/U Clinic Visit in 2 weeks.    Butch Penny, PharmD, CPP Clinical Pharmacist [facility] [phone]

## 2017-12-14 NOTE — Patient Instructions (Signed)
Thank you for coming to see us today.   Blood pressure today is improved.   Continue taking blood pressure medications as prescribed.   Limiting salt and caffeine, as well as exercising as able for at least 30 minutes for 5 days out of the week, can also help you lower your blood pressure.  Take your blood pressure at home if you are able. Please write down these numbers and bring them to your visits.  If you have any questions about medications, please call me [phone].  Audrey Montgomery  

## 2017-12-20 ENCOUNTER — Telehealth: Payer: Self-pay

## 2017-12-20 NOTE — Telephone Encounter (Signed)
CMA attempt to reach patient to inform on results  No answer and unable to leave a VM due to mailbox is full.  A letter will be send out to reach patient.  

## 2017-12-20 NOTE — Telephone Encounter (Signed)
-----   Message from Claiborne Rigg, NP sent at 12/15/2017  9:02 PM EST ----- Arterial study does not show any peripheral artery disease.

## 2017-12-29 ENCOUNTER — Ambulatory Visit: Payer: Managed Care, Other (non HMO) | Admitting: Pharmacist

## 2017-12-30 ENCOUNTER — Encounter: Payer: Self-pay | Admitting: Pharmacist

## 2017-12-30 ENCOUNTER — Ambulatory Visit: Payer: Managed Care, Other (non HMO) | Attending: Family Medicine | Admitting: Pharmacist

## 2017-12-30 VITALS — BP 155/83 | HR 68

## 2017-12-30 DIAGNOSIS — F17210 Nicotine dependence, cigarettes, uncomplicated: Secondary | ICD-10-CM | POA: Insufficient documentation

## 2017-12-30 DIAGNOSIS — I10 Essential (primary) hypertension: Secondary | ICD-10-CM | POA: Insufficient documentation

## 2017-12-30 DIAGNOSIS — Z79899 Other long term (current) drug therapy: Secondary | ICD-10-CM | POA: Insufficient documentation

## 2017-12-30 MED ORDER — VARENICLINE TARTRATE 1 MG PO TABS: 1.0000 mg | ORAL_TABLET | Freq: Two times a day (BID) | ORAL | 2 refills | Status: DC

## 2017-12-30 MED ORDER — AMLODIPINE BESYLATE 10 MG PO TABS: 10.0000 mg | ORAL_TABLET | Freq: Every day | ORAL | 0 refills | Status: DC

## 2017-12-30 NOTE — Progress Notes (Signed)
   S:    PCP: Bertram DenverZelda Fleming  Patient arrives in good spirits. Presents to the clinic for hypertension management. Patient was referred by Zelda on 11/23/17. Initial BP at that visit was 176/118. Clonidine 0.2 mgx1 administered in clinic. BP decreased to 157/104. Amlodipine 5 mg initiated.   Patient denies CP, SOB, HA or blurred vision.    Patient reports adherence with medications.  Current BP Medications include:   - amlodipine 5 mg  Antihypertensives tried in the past include:  - NKDA - no other medications tried in the past  Dietary habits include:  - Limits salt - 1 cup of coffee/day Exercise habits include: - On her feet all day at work Family / Social history:  - FH: stroke (brother) - Tobacco: currently on Chantix, reports only smoking 1-2 cigarettes a day - Alcohol: occasional  Home BP readings:  - Does not check at home  O:  L arm after 5 minutes rest: 155/83, HR 83  Last 3 Office BP readings: BP Readings from Last 3 Encounters:  12/14/17 129/86  11/23/17 (!) 157/104  08/11/17 113/74   BMET    Component Value Date/Time   NA 142 11/23/2017 1526   K 4.2 11/23/2017 1526   CL 104 11/23/2017 1526   CO2 24 11/23/2017 1526   GLUCOSE 81 11/23/2017 1526   GLUCOSE 128 (H) 08/11/2017 0522   BUN 13 11/23/2017 1526   CREATININE 1.06 (H) 11/23/2017 1526   CALCIUM 9.8 11/23/2017 1526   GFRNONAA 58 (L) 11/23/2017 1526   GFRAA 67 11/23/2017 1526   Renal function: CrCl cannot be calculated (Patient's most recent lab result is older than the maximum 21 days allowed.).  A/P: Hypertension longstanding currently uncontrolled on current medications. BP Goal <130/80 mmHg. Patient is adherent with amlodipine. She states that she has been under a lot of stress lately. Of note, pt has completed her starting month of Chantix and is requesting her continuing month (1 mg BID). -Increased amlodipine to 10 mg. -Chantix ordered -Lipid -Counseled on lifestyle modifications for  blood pressure control including reduced dietary sodium, increased exercise, adequate sleep  Results reviewed and written information provided.  Total time in face-to-face counseling 15 minutes.   F/U Clinic Visit in 2 weeks.    Leanne ChangJane Chu, PharmD Candidate [facility] [employer] [employer] of 2021  Butch PennyLuke Van Ausdall, PharmD, CPP Clinical Pharmacist [facility] [phone]

## 2017-12-30 NOTE — Patient Instructions (Signed)
Thank you for coming to see us today.   Blood pressure today is elevated.  We are increasing your amlodipine to 10 mg. Take 1 tablet daily.   Limiting salt and caffeine, as well as exercising as able for at least 30 minutes for 5 days out of the week, can also help you lower your blood pressure.  Take your blood pressure at home if you are able. Please write down these numbers and bring them to your visits.  If you have any questions about medications, please call me [phone].  Franky MachoLuke

## 2017-12-31 LAB — LIPID PANEL
CHOLESTEROL TOTAL: 172 mg/dL (ref 100–199)
Chol/HDL Ratio: 3 ratio (ref 0.0–4.4)
HDL: 57 mg/dL (ref >39)
LDL Calculated: 85 mg/dL (ref 0–99)
Triglycerides: 152 mg/dL — ABNORMAL HIGH (ref 0–149)
VLDL CHOLESTEROL CAL: 30 mg/dL (ref 5–40)

## 2018-01-03 ENCOUNTER — Telehealth: Payer: Self-pay | Admitting: General Practice

## 2018-01-03 ENCOUNTER — Telehealth: Payer: Self-pay

## 2018-01-03 NOTE — Telephone Encounter (Signed)
-----   Message from Hoy RegisterEnobong Newlin, MD sent at 12/31/2017 10:54 AM EST ----- Please inform the patient that labs are normal. Thank you.

## 2018-01-03 NOTE — Telephone Encounter (Signed)
Pt called in regards to -amLODipine (NORVASC) 10 MG tablet  She states that ever since she was switched to a higher dosage she has had a headache. Please advise.

## 2018-01-03 NOTE — Telephone Encounter (Signed)
Patient was called and informed of lab results. 

## 2018-01-03 NOTE — Telephone Encounter (Signed)
Call returned. Will have her decrease back to the 5 mg dose. She follows with Zelda on 01/24/18.

## 2018-01-24 ENCOUNTER — Encounter: Payer: Self-pay | Admitting: Nurse Practitioner

## 2018-01-24 ENCOUNTER — Other Ambulatory Visit: Payer: Self-pay

## 2018-01-24 ENCOUNTER — Ambulatory Visit: Payer: Managed Care, Other (non HMO) | Attending: Nurse Practitioner | Admitting: Nurse Practitioner

## 2018-01-24 VITALS — BP 165/99 | HR 61 | Temp 98.7°F | Resp 16 | Wt 171.1 lb

## 2018-01-24 DIAGNOSIS — M67432 Ganglion, left wrist: Secondary | ICD-10-CM | POA: Diagnosis not present

## 2018-01-24 DIAGNOSIS — I10 Essential (primary) hypertension: Secondary | ICD-10-CM | POA: Insufficient documentation

## 2018-01-24 DIAGNOSIS — Z79899 Other long term (current) drug therapy: Secondary | ICD-10-CM | POA: Insufficient documentation

## 2018-01-24 DIAGNOSIS — M79671 Pain in right foot: Secondary | ICD-10-CM | POA: Insufficient documentation

## 2018-01-24 DIAGNOSIS — Z791 Long term (current) use of non-steroidal anti-inflammatories (NSAID): Secondary | ICD-10-CM | POA: Insufficient documentation

## 2018-01-24 DIAGNOSIS — M79672 Pain in left foot: Secondary | ICD-10-CM

## 2018-01-24 MED ORDER — LISINOPRIL 20 MG PO TABS: 20.0000 mg | ORAL_TABLET | Freq: Every day | ORAL | 3 refills | Status: DC

## 2018-01-24 MED ORDER — CLONIDINE HCL 0.1 MG PO TABS
0.2000 mg | ORAL_TABLET | Freq: Once | ORAL | Status: AC
  Administered 2018-01-24: 0.2 mg via ORAL

## 2018-01-24 NOTE — Progress Notes (Signed)
Assessment & Plan:  Audrey Montgomery was seen today for follow-up.  Diagnoses and all orders for this visit:  Essential hypertension -     lisinopril (PRINIVIL,ZESTRIL) 20 MG tablet; Take 1 tablet (20 mg total) by mouth daily. -     cloNIDine (CATAPRES) tablet 0.2 mg Continue all antihypertensives as prescribed.  Remember to bring in your blood pressure log with you for your follow up appointment.  DASH/Mediterranean Diets are healthier choices for HTN.    Bilateral foot pain -     Ambulatory referral to Podiatry  Ganglion cyst of dorsum of left wrist Try direct pressure with application of ACE wrap or wrist splint. If unchanged after several weeks and continues with pain will need to see hand surgeon. Recommended on sparing use of ibuprofen due to kidney function.   Patient has been counseled on age-appropriate routine health concerns for screening and prevention. These are reviewed and up-to-date. Referrals have been placed accordingly. Immunizations are up-to-date or declined.    Subjective:   Chief Complaint  Patient presents with  . Follow-up   HPI Audrey Montgomery 57 y.o. female presents to office today for follow up HTN. She has cataracts of both eyes and states she is unable to afford to have her cataracts removed due to [name] costs.   Essential Hypertension Blood pressure is not well controlled today. Required clonidine here in the office today. She also has a headache 6/10. States she has not eaten in over 6 hours. Currently endorses medication compliance taking amlodipine 10mg . Will add lisinopril 20mg  today. She currently denies chest pain, shortness of breath, palpitations, lightheadedness, dizziness, visual disturbances or BLE edema.  BP Readings from Last 3 Encounters:  01/24/18 (!) 165/99  12/30/17 (!) 155/83  12/14/17 129/86     CYST She has a Ganglion cyst on the left wrist. There is tenderness associated with palpation of the area. She denies any numbness or  tingling in the hand or forearm. She has not tried any OTC medications for pain relief.  Foot Problem She endorses pain with walking. The pain is located on the lateral plantar sides of both feet. Chronic in nature. Pain is described as sharp and aching.  There is no pain with rest.  She has tried different shoes as well as replacing insoles in her shoes with no relief of pain. She denies any previous trauma or injury.   Review of Systems  Constitutional: Negative for fever, malaise/fatigue and weight loss.  HENT: Negative.  Negative for nosebleeds.   Eyes: Positive for blurred vision. Negative for double vision, photophobia, pain, discharge and redness.  Respiratory: Negative.  Negative for cough and shortness of breath.   Cardiovascular: Negative.  Negative for chest pain, palpitations and leg swelling.  Gastrointestinal: Negative.  Negative for heartburn, nausea and vomiting.  Musculoskeletal: Positive for joint pain. Negative for myalgias.       SEE HPI  Neurological: Negative.  Negative for dizziness, focal weakness, seizures and headaches.  Psychiatric/Behavioral: Negative.  Negative for suicidal ideas.    Past Medical History:  Diagnosis Date  . Cataract    Both Eyes.  . Hypertension     Past Surgical History:  Procedure Laterality Date  . APPENDECTOMY      Family History  Problem Relation Age of Onset  . Stroke Brother     Social History Reviewed with no changes to be made today.   Outpatient Medications Prior to Visit  Medication Sig Dispense Refill  . amLODipine (NORVASC) 10 MG  tablet Take 1 tablet (10 mg total) by mouth daily. 90 tablet 0  . ibuprofen (ADVIL,MOTRIN) 800 MG tablet Take 1 tablet (800 mg total) by mouth every 8 (eight) hours as needed. 60 tablet 1  . varenicline (CHANTIX CONTINUING MONTH PAK) 1 MG tablet Take 1 tablet (1 mg total) by mouth 2 (two) times daily. 56 tablet 2   No facility-administered medications prior to visit.     No Known  Allergies     Objective:    BP (!) 165/99   Pulse 61   Temp 98.7 F (37.1 C) (Oral)   Resp 16   Wt 171 lb 1.6 oz (77.6 kg)   LMP  (LMP Unknown)   SpO2 99%   BMI 24.55 kg/m  Wt Readings from Last 3 Encounters:  01/24/18 171 lb 1.6 oz (77.6 kg)  11/23/17 163 lb 9.6 oz (74.2 kg)  08/11/17 155 lb (70.3 kg)    Physical Exam Vitals signs and nursing note reviewed.  Constitutional:      Appearance: She is well-developed.  HENT:     Head: Normocephalic and atraumatic.  Neck:     Musculoskeletal: Normal range of motion.  Cardiovascular:     Rate and Rhythm: Normal rate and regular rhythm.     Heart sounds: Normal heart sounds. No murmur. No friction rub. No gallop.   Pulmonary:     Effort: Pulmonary effort is normal. No tachypnea or respiratory distress.     Breath sounds: Normal breath sounds. No decreased breath sounds, wheezing, rhonchi or rales.  Chest:     Chest wall: No tenderness.  Abdominal:     General: Bowel sounds are normal.     Palpations: Abdomen is soft.  Musculoskeletal: Normal range of motion.     Left wrist: She exhibits tenderness. She exhibits normal range of motion, no swelling, no effusion and no crepitus.       Arms:  Skin:    General: Skin is warm and dry.  Neurological:     Mental Status: She is alert and oriented to person, place, and time.     Coordination: Coordination normal.  Psychiatric:        Behavior: Behavior normal. Behavior is cooperative.        Thought Content: Thought content normal.        Judgment: Judgment normal.          Patient has been counseled extensively about nutrition and exercise as well as the importance of adherence with medications and regular follow-up. The patient was given clear instructions to go to ER or return to medical center if symptoms don't improve, worsen or new problems develop. The patient verbalized understanding.   Follow-up: Return in about 3 weeks (around 02/14/2018) for BP recheck with Franky MachoLuke;  needs BMP that day and 8 week appt. with ME.   Claiborne RiggZelda W Laurey Salser, FNP-BC [facility] [place] , [place] [phone]   01/24/2018, 8:29 PM

## 2018-01-24 NOTE — Patient Instructions (Addendum)
Preservision AREDS  Or AREDS with Zinc    Ganglion Cyst A ganglion cyst is a noncancerous, fluid-filled lump that occurs near joints or tendons. The ganglion cyst grows out of a joint or the lining of a tendon. It most often develops in the hand or wrist, but it can also develop in the shoulder, elbow, hip, knee, ankle, or foot. The round or oval ganglion cyst can be the size of a pea or larger than a grape. Increased activity may enlarge the size of the cyst because more fluid starts to build up. What are the causes? It is not known what causes a ganglion cyst to grow. However, it may be related to:  Inflammation or irritation around the joint.  An injury.  Repetitive movements or overuse.  Arthritis.  What increases the risk? Risk factors include:  Being a woman.  Being age 57-50.  What are the signs or symptoms? Symptoms may include:  A lump. This most often appears on the hand or wrist, but it can occur in other areas of the body.  Tingling.  Pain.  Numbness.  Muscle weakness.  Weak grip.  Less movement in a joint.  How is this diagnosed? Ganglion cysts are most often diagnosed based on a physical exam. Your health care provider will feel the lump and may shine a light alongside it. If it is a ganglion cyst, a light often shines through it. Your health care provider may order an X-ray, ultrasound, or MRI to rule out other conditions. How is this treated? Ganglion cysts usually go away on their own without treatment. If pain or other symptoms are involved, treatment may be needed. Treatment is also needed if the ganglion cyst limits your movement or if it gets infected. Treatment may include:  Wearing a brace or splint on your wrist or finger.  Taking anti-inflammatory medicine.  Draining fluid from the lump with a needle (aspiration).  Injecting a steroid into the joint.  Surgery to remove the ganglion cyst.  Follow these instructions at home:  Do not  press on the ganglion cyst, poke it with a needle, or hit it.  Take medicines only as directed by your health care provider.  Wear your brace or splint as directed by your health care provider.  Watch your ganglion cyst for any changes.  Keep all follow-up visits as directed by your health care provider. This is important. Contact a health care provider if:  Your ganglion cyst becomes larger or more painful.  You have increased redness, red streaks, or swelling.  You have pus coming from the lump.  You have weakness or numbness in the affected area.  You have a fever or chills. This information is not intended to replace advice given to you by your health care provider. Make sure you discuss any questions you have with your health care provider. Document Released: 01/24/2000 Document Revised: 07/04/2015 Document Reviewed: 07/11/2013 Elsevier Interactive Patient Education  2018 [employer]

## 2018-01-24 NOTE — Progress Notes (Signed)
Follow up HTN-  Knot on left wrist Pain in hands and feet Headache

## 2018-01-25 ENCOUNTER — Other Ambulatory Visit: Payer: Self-pay | Admitting: Pharmacist

## 2018-01-25 MED ORDER — AMLODIPINE BESYLATE 5 MG PO TABS: 5.0000 mg | ORAL_TABLET | Freq: Every day | ORAL | 0 refills | Status: DC

## 2018-01-27 ENCOUNTER — Ambulatory Visit (INDEPENDENT_AMBULATORY_CARE_PROVIDER_SITE_OTHER): Payer: Managed Care, Other (non HMO)

## 2018-01-27 ENCOUNTER — Other Ambulatory Visit: Payer: Self-pay

## 2018-01-27 ENCOUNTER — Ambulatory Visit (INDEPENDENT_AMBULATORY_CARE_PROVIDER_SITE_OTHER): Payer: Managed Care, Other (non HMO) | Admitting: Podiatry

## 2018-01-27 ENCOUNTER — Encounter: Payer: Self-pay | Admitting: Podiatry

## 2018-01-27 VITALS — Resp 15 | Ht 70.0 in | Wt 171.0 lb

## 2018-01-27 DIAGNOSIS — M7741 Metatarsalgia, right foot: Secondary | ICD-10-CM

## 2018-01-27 DIAGNOSIS — M79672 Pain in left foot: Secondary | ICD-10-CM

## 2018-01-27 DIAGNOSIS — M7742 Metatarsalgia, left foot: Secondary | ICD-10-CM

## 2018-01-27 DIAGNOSIS — M79673 Pain in unspecified foot: Secondary | ICD-10-CM

## 2018-01-27 DIAGNOSIS — G8929 Other chronic pain: Secondary | ICD-10-CM

## 2018-01-27 DIAGNOSIS — M779 Enthesopathy, unspecified: Secondary | ICD-10-CM

## 2018-01-27 DIAGNOSIS — M778 Other enthesopathies, not elsewhere classified: Secondary | ICD-10-CM

## 2018-01-27 DIAGNOSIS — M2041 Other hammer toe(s) (acquired), right foot: Secondary | ICD-10-CM

## 2018-01-27 DIAGNOSIS — M79671 Pain in right foot: Secondary | ICD-10-CM

## 2018-01-27 DIAGNOSIS — M2042 Other hammer toe(s) (acquired), left foot: Secondary | ICD-10-CM | POA: Diagnosis not present

## 2018-01-27 MED ORDER — METHYLPREDNISOLONE 4 MG PO TBPK: ORAL_TABLET | ORAL | 0 refills | Status: DC

## 2018-01-27 NOTE — Progress Notes (Signed)
   Subjective:    Patient ID: Audrey Montgomery, female    DOB: 04-25-1960, 57 y.o.   MRN: [id number]  HPI 57 year old female presents the office today for concerns of pain to both feet and she points to submetatarsal 5 risk is majority of symptoms.  This is been ongoing for several years and is gradually getting worse.  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  She is tried Epson salt soaks as well as in the milligrams ibuprofen and significant improvement.  She is also try compression socks.  No recent injury or falls.   Review of Systems  Musculoskeletal: Positive for arthralgias and myalgias.  All other systems reviewed and are negative.  Past Medical History:  Diagnosis Date  . Cataract    Both Eyes.  . Hypertension     Past Surgical History:  Procedure Laterality Date  . APPENDECTOMY       Current Outpatient Medications:  .  amLODipine (NORVASC) 5 MG tablet, Take 1 tablet (5 mg total) by mouth daily., Disp: 90 tablet, Rfl: 0 .  ibuprofen (ADVIL,MOTRIN) 800 MG tablet, Take 1 tablet (800 mg total) by mouth every 8 (eight) hours as needed., Disp: 60 tablet, Rfl: 1 .  lisinopril (PRINIVIL,ZESTRIL) 20 MG tablet, Take 1 tablet (20 mg total) by mouth daily., Disp: 90 tablet, Rfl: 3 .  varenicline (CHANTIX CONTINUING MONTH PAK) 1 MG tablet, Take 1 tablet (1 mg total) by mouth 2 (two) times daily., Disp: 56 tablet, Rfl: 2 .  methylPREDNISolone (MEDROL DOSEPAK) 4 MG TBPK tablet, Take as directed, Disp: 21 tablet, Rfl: 0  No Known Allergies      Objective:   Physical Exam General: AAO x3, NAD  Dermatological: Skin is warm, dry and supple bilateral. Nails x 10 are well manicured; remaining integument appears unremarkable at this time. There are no open sores, no preulcerative lesions, no rash or signs of infection present.  Vascular: Dorsalis Pedis artery and Posterior Tibial artery pedal pulses  are 2/4 bilateral with immedate capillary fill time. Pedal hair growth present. No varicosities and no lower extremity edema present bilateral. There is no pain with calf compression, swelling, warmth, erythema.   Neruologic: Grossly intact via light touch bilateral. Protective threshold with Semmes Wienstein monofilament intact to all pedal sites bilateral.  Negative Tinel sign.  Musculoskeletal: There is tenderness to bilateral lower extremity submetatarsal 5 and there is some mild localized edema to this area but there is no erythema or warmth.  Is no pain in the dorsal metatarsals.  There is no other areas of tenderness.  There is, metatarsal heads plantarly bilaterally.. Muscular strength 5/5 in all groups tested bilateral.  Gait: Unassisted, Nonantalgic.     Assessment & Plan:  57 year old female with bilateral capsulitis, metatarsalgia -Treatment options discussed including all alternatives, risks, and complications -Etiology of symptoms were discussed -X-rays were obtained and reviewed with the patient.  No evidence of acute fracture or stress fracture identified today. -Dispensed metatarsal offloading pads.  Also discussed that custom orthotics will take pressure off the areas as well and she wished to proceed today.  Rick evaluated her today and she was measured for inserts. -Prescribed a Medrol Dosepak.

## 2018-01-28 ENCOUNTER — Other Ambulatory Visit: Payer: Self-pay | Admitting: Podiatry

## 2018-01-28 DIAGNOSIS — M2042 Other hammer toe(s) (acquired), left foot: Principal | ICD-10-CM

## 2018-01-28 DIAGNOSIS — M2041 Other hammer toe(s) (acquired), right foot: Secondary | ICD-10-CM

## 2018-02-15 ENCOUNTER — Ambulatory Visit: Payer: Managed Care, Other (non HMO) | Attending: Family Medicine | Admitting: Pharmacist

## 2018-02-15 ENCOUNTER — Encounter: Payer: Self-pay | Admitting: Pharmacist

## 2018-02-15 VITALS — BP 123/79 | HR 67

## 2018-02-15 DIAGNOSIS — Z87891 Personal history of nicotine dependence: Secondary | ICD-10-CM | POA: Insufficient documentation

## 2018-02-15 DIAGNOSIS — I10 Essential (primary) hypertension: Secondary | ICD-10-CM | POA: Insufficient documentation

## 2018-02-15 DIAGNOSIS — Z79899 Other long term (current) drug therapy: Secondary | ICD-10-CM | POA: Insufficient documentation

## 2018-02-15 NOTE — Patient Instructions (Signed)

## 2018-02-15 NOTE — Progress Notes (Signed)
   S:    PCP: Zelda  Patient arrives in good spirits. Presents to the clinic for hypertension management. Patient was referred by Zelda on 01/24/18. At that visit, BP was 171/108. Clonidine given in clinic and BP decreased to 165/99. Zelda added lisinopril 20 mg to pt's regimen.   Patient reports adherence with medications. Denies chest pain, shortness of breath, HA, or blurred vision.   Current BP Medications include:  Amlodipine 10 mg daily, lisinopril 20 mg daily  Dietary habits include: limits salt, limits caffeine (1 cup of coffee/day) Exercise habits include:none outside of work Family / Social history: former smoker (reports quitting ~2 months ago), denies drinking alcohol  Home BP readings: cuff at home is broken  O:  L arm after 5 minutes rest: 123/79, HR 67 Last 3 Office BP readings: BP Readings from Last 3 Encounters:  02/15/18 123/79  01/24/18 (!) 165/99  12/30/17 (!) 155/83   BMET    Component Value Date/Time   NA 142 11/23/2017 1526   K 4.2 11/23/2017 1526   CL 104 11/23/2017 1526   CO2 24 11/23/2017 1526   GLUCOSE 81 11/23/2017 1526   GLUCOSE 128 (H) 08/11/2017 0522   BUN 13 11/23/2017 1526   CREATININE 1.06 (H) 11/23/2017 1526   CALCIUM 9.8 11/23/2017 1526   GFRNONAA 58 (L) 11/23/2017 1526   GFRAA 67 11/23/2017 1526    Renal function: CrCl cannot be calculated (Patient's most recent lab result is older than the maximum 21 days allowed.).  Clinical ASCVD: No  The 10-year ASCVD risk score Denman George DC Jr., et al., 2013) is: 4.2%   Values used to calculate the score:     Age: 58 years     Sex: Female     Is Non-Hispanic African American: Yes     Diabetic: No     Tobacco smoker: No     Systolic Blood Pressure: 123 mmHg     Is BP treated: Yes     HDL Cholesterol: 57 mg/dL     Total Cholesterol: 172 mg/dL   A/P: Hypertension longstanding currently controlled on current medications. BP Goal <130/80 mmHg. Patient is adherent with current medications.    -Continued current regimen.  -F/u labs ordered - BMP -Counseled on lifestyle modifications for blood pressure control including reduced dietary sodium, increased exercise, adequate sleep  Results reviewed and written information provided. Total time in face-to-face counseling 30 minutes.   F/U Clinic Visit 03/14/2018 with Zelda.    Butch Penny, PharmD, CPP Clinical Pharmacist [facility] [phone]

## 2018-02-16 LAB — BASIC METABOLIC PANEL
BUN/Creatinine Ratio: 16 (ref 9–23)
BUN: 18 mg/dL (ref 6–24)
CALCIUM: 9.5 mg/dL (ref 8.7–10.2)
CO2: 24 mmol/L (ref 20–29)
Chloride: 107 mmol/L — ABNORMAL HIGH (ref 96–106)
Creatinine, Ser: 1.16 mg/dL — ABNORMAL HIGH (ref 0.57–1.00)
GFR, EST AFRICAN AMERICAN: 60 mL/min/{1.73_m2} (ref >59)
GFR, EST NON AFRICAN AMERICAN: 52 mL/min/{1.73_m2} — AB (ref >59)
Glucose: 46 mg/dL — ABNORMAL LOW (ref 65–99)
Potassium: 4.3 mmol/L (ref 3.5–5.2)
Sodium: 146 mmol/L — ABNORMAL HIGH (ref 134–144)

## 2018-02-21 ENCOUNTER — Ambulatory Visit: Payer: Managed Care, Other (non HMO) | Admitting: Orthotics

## 2018-02-21 ENCOUNTER — Ambulatory Visit (INDEPENDENT_AMBULATORY_CARE_PROVIDER_SITE_OTHER): Payer: Managed Care, Other (non HMO) | Admitting: Podiatry

## 2018-02-21 DIAGNOSIS — M778 Other enthesopathies, not elsewhere classified: Secondary | ICD-10-CM

## 2018-02-21 DIAGNOSIS — G8929 Other chronic pain: Secondary | ICD-10-CM

## 2018-02-21 DIAGNOSIS — M779 Enthesopathy, unspecified: Secondary | ICD-10-CM

## 2018-02-21 DIAGNOSIS — M7741 Metatarsalgia, right foot: Secondary | ICD-10-CM

## 2018-02-21 DIAGNOSIS — M7742 Metatarsalgia, left foot: Secondary | ICD-10-CM

## 2018-02-21 DIAGNOSIS — M79673 Pain in unspecified foot: Secondary | ICD-10-CM

## 2018-02-21 MED ORDER — DICLOFENAC SODIUM 1 % TD GEL: 2.0000 g | Freq: Four times a day (QID) | TRANSDERMAL | 2 refills | Status: DC

## 2018-02-21 NOTE — Progress Notes (Signed)
Patient came in today to pick up custom made foot orthotics.  The goals were accomplished and the patient reported no dissatisfaction with said orthotics.  Patient was advised of breakin period and how to report any issues. 

## 2018-02-28 NOTE — Progress Notes (Signed)
Subjective: 58 year old female presents the office today for follow-up evaluation and to pick up orthotics.  She states that the inserts that were dispensed today are comfortable but she just got them today.  She states that the spots on the left foot are hurting but the right foot is doing well.  She denies any increase in swelling or redness.  Overall she has had some improvement since the birth started seeing her. Denies any systemic complaints such as fevers, chills, nausea, vomiting. No acute changes since last appointment, and no other complaints at this time.   Objective: AAO x3, NAD DP/PT pulses palpable bilaterally, CRT less than 3 seconds Positive fifth metatarsal head plantarly with the left side worse than the right and this is where she is majority discomfort.  There is no other areas of tenderness elicited at this time.  There is no significant edema, erythema. No open lesions or pre-ulcerative lesions.  No pain with calf compression, swelling, warmth, erythema  Assessment: Capsulitis/metatarsalgia  Plan: -All treatment options discussed with the patient including all alternatives, risks, complications.  -Orthotics were dispensed today and they appear to be offloading the area.  Break-in instructions were discussed.  Can use moisturizer to the feet.  Mostly orthotics do not need adjustments we will be happy to do so for her.  -Patient encouraged to call the office with any questions, concerns, change in symptoms.   Vivi BarrackMatthew R Wagoner DPM

## 2018-03-04 ENCOUNTER — Encounter (INDEPENDENT_AMBULATORY_CARE_PROVIDER_SITE_OTHER): Payer: Self-pay

## 2018-03-14 ENCOUNTER — Encounter: Payer: Self-pay | Admitting: Nurse Practitioner

## 2018-03-14 ENCOUNTER — Ambulatory Visit: Payer: Managed Care, Other (non HMO) | Attending: Nurse Practitioner | Admitting: Nurse Practitioner

## 2018-03-14 VITALS — BP 113/80 | HR 93 | Temp 99.7°F | Ht 70.0 in | Wt 179.2 lb

## 2018-03-14 DIAGNOSIS — F17210 Nicotine dependence, cigarettes, uncomplicated: Secondary | ICD-10-CM

## 2018-03-14 DIAGNOSIS — M62838 Other muscle spasm: Secondary | ICD-10-CM

## 2018-03-14 DIAGNOSIS — F17200 Nicotine dependence, unspecified, uncomplicated: Secondary | ICD-10-CM

## 2018-03-14 DIAGNOSIS — G5603 Carpal tunnel syndrome, bilateral upper limbs: Secondary | ICD-10-CM

## 2018-03-14 DIAGNOSIS — R200 Anesthesia of skin: Secondary | ICD-10-CM

## 2018-03-14 DIAGNOSIS — R202 Paresthesia of skin: Secondary | ICD-10-CM | POA: Diagnosis not present

## 2018-03-14 MED ORDER — DULOXETINE HCL 20 MG PO CPEP: 20.0000 mg | ORAL_CAPSULE | Freq: Every day | ORAL | 3 refills | Status: DC

## 2018-03-14 NOTE — Progress Notes (Signed)
Assessment & Plan:  Audrey Montgomery was seen today for follow-up.  Diagnoses and all orders for this visit:  Bilateral carpal tunnel syndrome -     DULoxetine (CYMBALTA) 20 MG capsule; Take 1 capsule (20 mg total) by mouth daily for 30 days. She declines hand surgeon referral today  Instructed to wear bilateral wrist splints for the next 4-6 weeks.  Muscle spasms of both lower extremities -     CMP14+EGFR -     B12 and Folate Panel -     Magnesium Encouraged patient to stay hydrated and drink plenty of water.  Numbness and tingling in both hands -     CMP14+EGFR -     B12 and Folate Panel -     Magnesium -     DULoxetine (CYMBALTA) 20 MG capsule; Take 1 capsule (20 mg total) by mouth daily for 30 days.  Tobacco dependence Audrey Montgomery was counseled on the dangers of tobacco use, and was advised to quit. Reviewed strategies to maximize success, including removing cigarettes and smoking materials from environment, stress management and support of family/friends as well as pharmacological alternatives including: Wellbutrin, Chantix, Nicotine patch, Nicotine gum or lozenges. Smoking cessation support: smoking cessation hotline: 1-800-QUIT-NOW.  Smoking cessation classes are also available through [facility]. Call (786)835-5040 or visit our website at [email or web address]   A total of 3 minutes was spent on counseling for smoking cessation and Audrey Montgomery is not ready to quit.    Patient has been counseled on age-appropriate routine health concerns for screening and prevention. These are reviewed and up-to-date. Referrals have been placed accordingly. Immunizations are up-to-date or declined.    Subjective:   Chief Complaint  Patient presents with  . Follow-up    Pt. is here to follow-up on hypertension. Patient stated she would like Physcian to check the knot on her left wrist.    HPI Audrey Montgomery 58 y.o. female presents to office today    Carpal Tunnel  Syndrome: Patient presents for presents evaluation of pain in hands, hand paresthesias and possible carpal tunnel syndrome.  Onset of the symptoms was several months ago. Current symptoms include numbness and tingling in the tips of her bilateral fingers with symptoms worse on the left hand. Inciting event/aggravating factors: repetitive activity Patient's course of Audrey Montgomery. Evaluation to date: orthopedics evaluation: she had previous carpal tunnel surgery of the right hand.  Treatment to date: none.   Muscle Pain: Patient complains of myalgias for which has been present for several weeks. Pain is located in bilateral calves R>L, is described as sharp, throbbing, tight band and cramping, and is intermittent occurring at night.  Associated symptoms include: none.  The patient has tried nothing for pain relief.  Related to injury:   No.   Review of Systems  Constitutional: Negative for fever, malaise/fatigue and weight loss.  HENT: Negative.  Negative for nosebleeds.   Eyes: Negative.  Negative for blurred vision, double vision and photophobia.  Respiratory: Negative.  Negative for cough and shortness of breath.   Cardiovascular: Negative.  Negative for chest pain, palpitations and leg swelling.  Gastrointestinal: Negative.  Negative for heartburn, nausea and vomiting.  Musculoskeletal: Positive for joint pain and myalgias.  Skin:       Ganglion cyst left wrist  Neurological: Positive for tingling and sensory change. Negative for dizziness, focal weakness, seizures and headaches.  Psychiatric/Behavioral: Negative.  Negative for suicidal ideas.    Past Medical History:  Diagnosis Date  . Cataract  Both Eyes.  . Hypertension     Past Surgical History:  Procedure Laterality Date  . APPENDECTOMY      Family History  Problem Relation Age of Onset  . Stroke Brother     Social History Reviewed with no changes to be made today.   Outpatient Medications Prior to Visit  Medication Sig  Dispense Refill  . amLODipine (NORVASC) 5 MG tablet Take 1 tablet (5 mg total) by mouth daily. 90 tablet 0  . diclofenac sodium (VOLTAREN) 1 % GEL Apply 2 g topically 4 (four) times daily. Rub into affected area of foot 2 to 4 times daily 100 g 2  . lisinopril (PRINIVIL,ZESTRIL) 20 MG tablet Take 1 tablet (20 mg total) by mouth daily. 90 tablet 3  . varenicline (CHANTIX CONTINUING MONTH PAK) 1 MG tablet Take 1 tablet (1 mg total) by mouth 2 (two) times daily. 56 tablet 2  . ibuprofen (ADVIL,MOTRIN) 800 MG tablet Take 1 tablet (800 mg total) by mouth every 8 (eight) hours as needed. 60 tablet 1  . methylPREDNISolone (MEDROL DOSEPAK) 4 MG TBPK tablet Take as directed 21 tablet 0   No facility-administered medications prior to visit.     No Known Allergies     Objective:    BP 113/80 (BP Location: Left Arm, Patient Position: Sitting, Cuff Size: Normal)   Pulse 93   Temp 99.7 F (37.6 C) (Oral)   Ht '5\' 10"'  (1.778 m)   Wt 179 lb 3.2 oz (81.3 kg)   LMP  (LMP Unknown)   SpO2 100%   BMI 25.71 kg/m  Wt Readings from Last 3 Encounters:  03/14/18 179 lb 3.2 oz (81.3 kg)  01/27/18 171 lb (77.6 kg)  01/24/18 171 lb 1.6 oz (77.6 kg)    Physical Exam Vitals signs and nursing note reviewed.  Constitutional:      Appearance: She is well-developed.  HENT:     Head: Normocephalic and atraumatic.  Neck:     Musculoskeletal: Normal range of motion.  Cardiovascular:     Rate and Rhythm: Normal rate and regular rhythm.     Heart sounds: Normal heart sounds. No murmur. No friction rub. No gallop.   Pulmonary:     Effort: Pulmonary effort is normal. No tachypnea or respiratory distress.     Breath sounds: Normal breath sounds. No decreased breath sounds, wheezing, rhonchi or rales.  Chest:     Chest wall: No tenderness.  Abdominal:     General: Bowel sounds are normal.     Palpations: Abdomen is soft.  Musculoskeletal: Normal range of motion.     Right wrist: She exhibits normal range of  motion.     Left wrist: She exhibits tenderness. She exhibits normal range of motion.       Arms:     Right lower leg: She exhibits no tenderness and no swelling.     Left lower leg: She exhibits no tenderness and no swelling.  Skin:    General: Skin is warm and dry.  Neurological:     Mental Status: She is alert and oriented to person, place, and time.     Motor: Motor function is intact.     Coordination: Coordination is intact. Coordination normal.     Comments: Positive phalen  Psychiatric:        Behavior: Behavior normal. Behavior is cooperative.        Thought Content: Thought content normal.        Judgment: Judgment normal.  Patient has been counseled extensively about nutrition and exercise as well as the importance of adherence with medications and regular follow-up. The patient was given clear instructions to go to ER or return to medical center if symptoms don't improve, worsen or new problems develop. The patient verbalized understanding.   Follow-up: Return in about 6 weeks (around 04/25/2018) for bilateral hand pain.   [name], FNP-BC [facility] [place], [place]   03/14/2018, 4:09 PM

## 2018-03-14 NOTE — Patient Instructions (Signed)
Carpal Tunnel Syndrome    Carpal tunnel syndrome is a condition that causes pain in your hand and arm. The carpal tunnel is a narrow area that is on the palm side of your wrist. Repeated wrist motion or certain diseases may cause swelling in the tunnel. This swelling can pinch the main nerve in the wrist (median nerve).  What are the causes?  This condition may be caused by:   Repeated wrist motions.   Wrist injuries.   Arthritis.   A sac of fluid (cyst) or abnormal growth (tumor) in the carpal tunnel.   Fluid buildup during pregnancy.  Sometimes the cause is not known.  What increases the risk?  The following factors may make you more likely to develop this condition:   Having a job in which you move your wrist in the same way many times. This includes jobs like being a butcher or a cashier.   Being a woman.   Having other health conditions, such as:  ? Diabetes.  ? Obesity.  ? A thyroid gland that is not active enough (hypothyroidism).  ? Kidney failure.  What are the signs or symptoms?  Symptoms of this condition include:   A tingling feeling in your fingers.   Tingling or a loss of feeling (numbness) in your hand.   Pain in your entire arm. This pain may get worse when you bend your wrist and elbow for a long time.   Pain in your wrist that goes up your arm to your shoulder.   Pain that goes down into your palm or fingers.   A weak feeling in your hands. You may find it hard to grab and hold items.  You may feel worse at night.  How is this diagnosed?  This condition is diagnosed with a medical history and physical exam. You may also have tests, such as:   Electromyogram (EMG). This test checks the signals that the nerves send to the muscles.   Nerve conduction study. This test checks how well signals pass through your nerves.   Imaging tests, such as X-rays, ultrasound, and MRI. These tests check for what might be the cause of your condition.  How is this treated?  This condition may be treated  with:   Lifestyle changes. You will be asked to stop or change the activity that caused your problem.   Doing exercise and activities that make bones and muscles stronger (physical therapy).   Learning how to use your hand again (occupational therapy).   Medicines for pain and swelling (inflammation). You may have injections in your wrist.   A wrist splint.   Surgery.  Follow these instructions at home:  If you have a splint:   Wear the splint as told by your doctor. Remove it only as told by your doctor.   Loosen the splint if your fingers:  ? Tingle.  ? Lose feeling (become numb).  ? Turn cold and blue.   Keep the splint clean.   If the splint is not waterproof:  ? Do not let it get wet.  ? Cover it with a watertight covering when you take a bath or a shower.  Managing pain, stiffness, and swelling     If told, put ice on the painful area:  ? If you have a removable splint, remove it as told by your doctor.  ? Put ice in a plastic bag.  ? Place a towel between your skin and the bag.  ? Leave the   ice on for 20 minutes, 2-3 times per day.  General instructions   Take over-the-counter and prescription medicines only as told by your doctor.   Rest your wrist from any activity that may cause pain. If needed, talk with your boss at work about changes that can help your wrist heal.   Do any exercises as told by your doctor, physical therapist, or occupational therapist.   Keep all follow-up visits as told by your doctor. This is important.  Contact a doctor if:   You have new symptoms.   Medicine does not help your pain.   Your symptoms get worse.  Get help right away if:   You have very bad numbness or tingling in your wrist or hand.  Summary   Carpal tunnel syndrome is a condition that causes pain in your hand and arm.   It is often caused by repeated wrist motions.   Lifestyle changes and medicines are used to treat this problem. Surgery may help in very bad cases.   Follow your doctor's  instructions about wearing a splint, resting your wrist, keeping follow-up visits, and calling for help.  This information is not intended to replace advice given to you by your health care provider. Make sure you discuss any questions you have with your health care provider.  Document Released: 01/15/2011 Document Revised: 06/04/2017 Document Reviewed: 06/04/2017  Elsevier Interactive Patient Education  2019 [employer]

## 2018-03-15 LAB — MAGNESIUM: Magnesium: 1.9 mg/dL (ref 1.6–2.3)

## 2018-03-15 LAB — CMP14+EGFR
A/G RATIO: 2.4 — AB (ref 1.2–2.2)
ALK PHOS: 90 IU/L (ref 39–117)
ALT: 12 IU/L (ref 0–32)
AST: 13 IU/L (ref 0–40)
Albumin: 4.6 g/dL (ref 3.8–4.9)
BUN / CREAT RATIO: 17 (ref 9–23)
BUN: 22 mg/dL (ref 6–24)
Bilirubin Total: 0.6 mg/dL (ref 0.0–1.2)
CO2: 20 mmol/L (ref 20–29)
Calcium: 9.1 mg/dL (ref 8.7–10.2)
Chloride: 104 mmol/L (ref 96–106)
Creatinine, Ser: 1.28 mg/dL — ABNORMAL HIGH (ref 0.57–1.00)
GFR calc Af Amer: 54 mL/min/{1.73_m2} — ABNORMAL LOW (ref >59)
GFR calc non Af Amer: 47 mL/min/{1.73_m2} — ABNORMAL LOW (ref >59)
GLOBULIN, TOTAL: 1.9 g/dL (ref 1.5–4.5)
Glucose: 89 mg/dL (ref 65–99)
POTASSIUM: 4.2 mmol/L (ref 3.5–5.2)
SODIUM: 141 mmol/L (ref 134–144)
Total Protein: 6.5 g/dL (ref 6.0–8.5)

## 2018-03-15 LAB — B12 AND FOLATE PANEL
FOLATE: 4.4 ng/mL (ref >3.0)
Vitamin B-12: 613 pg/mL (ref 232–1245)

## 2018-03-17 ENCOUNTER — Telehealth: Payer: Self-pay

## 2018-03-17 NOTE — Telephone Encounter (Signed)
-----   Message from Claiborne Rigg, NP sent at 03/16/2018 11:54 PM EST ----- Kidney function is decreased slightly. Make sure you are not taking NSAIDs; Ibuprofen, Advil, Aleve, Motrin as these medications can affect your kidney function. Will recheck at next office visit. Your B12 and Magnesium are normal.

## 2018-03-17 NOTE — Telephone Encounter (Signed)
CMA attempt to reach patient to inform on results  No answer and unable to leave a VM due to mailbox is full.  A letter will be send out to reach patient.

## 2018-04-27 ENCOUNTER — Encounter: Payer: Self-pay | Admitting: Nurse Practitioner

## 2018-04-27 ENCOUNTER — Ambulatory Visit: Payer: Managed Care, Other (non HMO) | Attending: Nurse Practitioner | Admitting: Nurse Practitioner

## 2018-04-27 ENCOUNTER — Other Ambulatory Visit: Payer: Self-pay

## 2018-04-27 VITALS — BP 131/79 | HR 74 | Temp 98.3°F | Ht 70.0 in | Wt 178.0 lb

## 2018-04-27 DIAGNOSIS — I10 Essential (primary) hypertension: Secondary | ICD-10-CM

## 2018-04-27 DIAGNOSIS — G5603 Carpal tunnel syndrome, bilateral upper limbs: Secondary | ICD-10-CM | POA: Diagnosis not present

## 2018-04-27 MED ORDER — AMLODIPINE BESYLATE 5 MG PO TABS: 5.0000 mg | ORAL_TABLET | Freq: Every day | ORAL | 1 refills | Status: DC

## 2018-04-27 NOTE — Progress Notes (Signed)
Assessment & Plan:  Audrey Montgomery was seen today for hand pain.  Diagnoses and all orders for this visit:  Bilateral carpal tunnel syndrome -     Ambulatory referral to Hand Surgery Continue wrist splints as instructed.   Essential hypertension -     amLODipine (NORVASC) 5 MG tablet; Take 1 tablet (5 mg total) by mouth daily. Continue all antihypertensives as prescribed.  Remember to bring in your blood pressure log with you for your follow up appointment.  DASH/Mediterranean Diets are healthier choices for HTN.    Patient has been counseled on age-appropriate routine health concerns for screening and prevention. These are reviewed and up-to-date. Referrals have been placed accordingly. Immunizations are up-to-date or declined.    Subjective:   Chief Complaint  Patient presents with  . Hand Pain    Pt. stated her hand pain are much worst.    HPI Audrey Montgomery 58 y.o. female presents to office today for follow up to bilateral hand pain. SHE STOPPED SMOKING LAST MONTH!!!  Carpal Tunnel Syndrome Onset of the symptoms was several months ago. Current symptoms include Numbness and tingling in the bilateral fingertips. . 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 Evaluation to date: none.  Treatment to date: prescription NSAIDS and cymbalta which have not been very effective and wrist splints used for several weeks, which has been not very effective. Also when she attempts to flat iron her hair or hold small objects in her hands she experiences her hands "locking up" and cramping sensation which are only relieved with rest and hand massaging for 5-10 minutes. She had right hand carpal tunnel release surgery about 10-150 years ago.   ESSENTIAL HYPERTENSION Chronic and well controlled. Taking amlodipine 5mg  and  lisinopril 20mg  tablets daily. Denies chest pain, shortness of breath, palpitations, lightheadedness, dizziness, headaches or BLE edema. She does try to avoid excessive sodium in her diet. Does not exercise as often as she would like to.  BP Readings from Last 3 Encounters:  04/27/18 131/79  03/14/18 113/80  02/15/18 123/79   Review of Systems  Constitutional: Negative for fever, malaise/fatigue and weight loss.  HENT: Negative.  Negative for nosebleeds.   Eyes: Negative.  Negative for blurred vision, double vision and photophobia.  Respiratory: Negative.  Negative for cough and shortness of breath.   Cardiovascular: Negative.  Negative for chest pain, palpitations and leg swelling.  Gastrointestinal: Negative.  Negative for heartburn, nausea and vomiting.  Musculoskeletal: Positive for joint pain. Negative for myalgias.  Neurological: Positive for tingling and sensory change. Negative for dizziness, focal weakness, seizures and headaches.  Psychiatric/Behavioral: Negative.  Negative for suicidal ideas.    Past Medical History:  Diagnosis Date  . Cataract    Both Eyes.  . Hypertension     Past Surgical History:  Procedure Laterality Date  . APPENDECTOMY      Family History  Problem Relation Age of Onset  . Stroke Brother     Social History Reviewed with no changes to be made today.   Outpatient Medications Prior to Visit  Medication Sig Dispense Refill  . diclofenac sodium (VOLTAREN) 1 % GEL Apply 2 g topically 4 (four) times daily. Rub into affected area of foot 2 to 4 times daily 100 g 2  . lisinopril (PRINIVIL,ZESTRIL) 20 MG tablet Take 1 tablet (20 mg total) by mouth daily. 90  tablet 3  . amLODipine (NORVASC) 5 MG tablet Take 1 tablet (5 mg total) by mouth daily. 90 tablet 0  . varenicline (CHANTIX CONTINUING MONTH PAK) 1 MG tablet Take 1 tablet (1 mg total) by mouth 2 (two) times daily. (Patient not taking: Reported on 04/27/2018) 56 tablet 2  . DULoxetine (CYMBALTA) 20  MG capsule Take 1 capsule (20 mg total) by mouth daily for 30 days. 30 capsule 3   No facility-administered medications prior to visit.     No Known Allergies     Objective:    BP 131/79 (BP Location: Left Arm, Patient Position: Sitting, Cuff Size: Normal)   Pulse 74   Temp 98.3 F (36.8 C) (Oral)   Ht 5\' 10"  (1.778 m)   Wt 178 lb (80.7 kg)   LMP  (LMP Unknown)   SpO2 97%   BMI 25.54 kg/m  Wt Readings from Last 3 Encounters:  04/27/18 178 lb (80.7 kg)  03/14/18 179 lb 3.2 oz (81.3 kg)  01/27/18 171 lb (77.6 kg)    Physical Exam Vitals signs and nursing note reviewed.  Constitutional:      Appearance: She is well-developed.  HENT:     Head: Normocephalic and atraumatic.  Neck:     Musculoskeletal: Normal range of motion.  Cardiovascular:     Rate and Rhythm: Normal rate and regular rhythm.     Heart sounds: Normal heart sounds. No murmur. No friction rub. No gallop.   Pulmonary:     Effort: Pulmonary effort is normal. No tachypnea or respiratory distress.     Breath sounds: Normal breath sounds. No decreased breath sounds, wheezing, rhonchi or rales.  Chest:     Chest wall: No tenderness.  Abdominal:     General: Bowel sounds are normal.     Palpations: Abdomen is soft.  Musculoskeletal: Normal range of motion.     Right wrist: She exhibits normal range of motion and no swelling.     Left wrist: She exhibits normal range of motion and no swelling.       Arms:     Right hand: She exhibits normal range of motion.     Left hand: She exhibits normal range of motion.  Skin:    General: Skin is warm and dry.  Neurological:     Mental Status: She is alert and oriented to person, place, and time.     Coordination: Coordination normal.  Psychiatric:        Behavior: Behavior normal. Behavior is cooperative.        Thought Content: Thought content normal.        Judgment: Judgment normal.          Patient has been counseled extensively about nutrition and  exercise as well as the importance of adherence with medications and regular follow-up. The patient was given clear instructions to go to ER or return to medical center if symptoms don't improve, worsen or new problems develop. The patient verbalized understanding.   Follow-up: Return for PAP SMEAR.   Claiborne Rigg, FNP-BC [facility] [place], [place] [phone]   04/27/2018, 5:50 PM

## 2018-05-04 ENCOUNTER — Encounter (INDEPENDENT_AMBULATORY_CARE_PROVIDER_SITE_OTHER): Payer: Self-pay | Admitting: Orthopaedic Surgery

## 2018-05-04 ENCOUNTER — Ambulatory Visit (INDEPENDENT_AMBULATORY_CARE_PROVIDER_SITE_OTHER): Payer: Managed Care, Other (non HMO) | Admitting: Orthopaedic Surgery

## 2018-05-04 ENCOUNTER — Other Ambulatory Visit: Payer: Self-pay

## 2018-05-04 DIAGNOSIS — M67432 Ganglion, left wrist: Secondary | ICD-10-CM

## 2018-05-04 DIAGNOSIS — G5602 Carpal tunnel syndrome, left upper limb: Secondary | ICD-10-CM | POA: Diagnosis not present

## 2018-05-04 DIAGNOSIS — G5601 Carpal tunnel syndrome, right upper limb: Secondary | ICD-10-CM

## 2018-05-04 NOTE — Addendum Note (Signed)
Addended by: Albertina Parr on: 05/04/2018 02:13 PM   Modules accepted: Orders

## 2018-05-04 NOTE — Progress Notes (Signed)
Office Visit Note   Patient: Audrey Montgomery           Date of Birth: 07/10/1960           MRN: [id number] Visit Date: 05/04/2018              Requested by: Claiborne Rigg, NP [address] [place], [place] [address] PCP: Claiborne Rigg, NP   Assessment & Plan: Visit Diagnoses:  1. Left carpal tunnel syndrome   2. Right carpal tunnel syndrome   3. Ganglion cyst of volar aspect of left wrist     Plan: Impression is bilateral carpal tunnel syndrome that is recurrent on the right with left volar wrist ganglion cyst.  We had discussion today regarding the need for nerve conduction studies to assess the severity of her suspected carpal tunnel syndrome.  We will see her back after those studies are available for review.  She would also like to have the left wrist ganglion cyst removed concurrently with her left carpal tunnel release.  We will see her back to review the nerve conduction studies.  Follow-Up Instructions: Return if symptoms worsen or fail to improve.   Orders:  No orders of the defined types were placed in this encounter.  No orders of the defined types were placed in this encounter.     Procedures: No procedures performed   Clinical Data: No additional findings.   Subjective: Chief Complaint  Patient presents with  . Left Hand - Pain  . Right Hand - Pain    Audrey Montgomery is a very pleasant 58 year old female comes in now for evaluation of bilateral hand tingling numbness and burning with associated volar left wrist ganglion cyst.  She is status post right carpal tunnel release and right foot volar wrist ganglion cyst removal approximately 15 years ago in [place] and she did well.  However she recently has had worsening carpal tunnel symptoms that are characterized with numbness and tingling and burning and she is also dropping things.  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  She has been wearing carpal tunnel braces  at night for the last 2 months with minimal relief.   Review of Systems  Constitutional: Negative.   HENT: Negative.   Eyes: Negative.   Respiratory: Negative.   Cardiovascular: Negative.   Endocrine: Negative.   Musculoskeletal: Negative.   Neurological: Negative.   Hematological: Negative.   Psychiatric/Behavioral: Negative.   All other systems reviewed and are negative.    Objective: Vital Signs: LMP  (LMP Unknown)   Physical Exam Vitals signs and nursing note reviewed.  Constitutional:      Appearance: She is well-developed.  HENT:     Head: Normocephalic and atraumatic.  Neck:     Musculoskeletal: Neck supple.  Pulmonary:     Effort: Pulmonary effort is normal.  Abdominal:     Palpations: Abdomen is soft.  Skin:    General: Skin is warm.     Capillary Refill: Capillary refill takes less than 2 seconds.  Neurological:     Mental Status: She is alert and oriented to person, place, and time.  Psychiatric:        Behavior: Behavior normal.        Thought Content: Thought content normal.        Judgment: Judgment normal.     Ortho Exam Bilateral hand exam shows no muscle atrophy.  She has positive Durkin's and Phalen's.  A positive Tinel's.  She has a palpable semi-firm semimobile cyst on the volar aspect of the left wrist with a strong palpable radial pulse adjacent to it.  There is no neurovascular compromise. Specialty Comments:  No specialty comments available.  Imaging: No results found.   PMFS History: Patient Active Problem List   Diagnosis Date Noted  . Viral upper respiratory tract infection 10/29/2015  . Elevated CK 10/03/2015  . Osteoarthritis of multiple joints 10/03/2015  . 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 Essential hypertension 06/18/2015  . Dizziness 07/16/2011  . Headache 07/16/2011  . Night sweats 07/16/2011  . Numbness 07/16/2011  . Weakness 07/16/2011   Past Medical History:  Diagnosis Date  . Cataract    Both Eyes.  .  Hypertension     Family History  Problem Relation Age of Onset  . Stroke Brother     Past Surgical History:  Procedure Laterality Date  . APPENDECTOMY     Social History   Occupational History  . Not on file  Tobacco Use  . Smoking status: Current Some Day Smoker    Packs/day: 0.50    Types: Cigarettes    Last attempt to quit: 12/25/2017    Years since quitting: 0.3  . Smokeless tobacco: Never Used  . Tobacco comment: Pt. stated she stopped smoking for a month. 04/27/2018  Substance and Sexual Activity  . Alcohol use: Not Currently  . Drug use: No  . Sexual activity: Yes

## 2018-05-24 ENCOUNTER — Encounter: Payer: Self-pay | Admitting: Internal Medicine

## 2018-05-24 ENCOUNTER — Other Ambulatory Visit: Payer: Self-pay

## 2018-05-24 ENCOUNTER — Ambulatory Visit: Payer: Managed Care, Other (non HMO) | Attending: Internal Medicine | Admitting: Internal Medicine

## 2018-05-24 DIAGNOSIS — R509 Fever, unspecified: Secondary | ICD-10-CM | POA: Diagnosis not present

## 2018-05-24 DIAGNOSIS — R05 Cough: Secondary | ICD-10-CM | POA: Diagnosis not present

## 2018-05-24 DIAGNOSIS — R0981 Nasal congestion: Secondary | ICD-10-CM | POA: Diagnosis not present

## 2018-05-24 DIAGNOSIS — Z20828 Contact with and (suspected) exposure to other viral communicable diseases: Secondary | ICD-10-CM

## 2018-05-24 DIAGNOSIS — Z20822 Contact with and (suspected) exposure to covid-19: Secondary | ICD-10-CM

## 2018-05-24 DIAGNOSIS — R6889 Other general symptoms and signs: Principal | ICD-10-CM

## 2018-05-24 DIAGNOSIS — R0602 Shortness of breath: Secondary | ICD-10-CM | POA: Diagnosis not present

## 2018-05-24 DIAGNOSIS — R197 Diarrhea, unspecified: Secondary | ICD-10-CM

## 2018-05-24 NOTE — Patient Instructions (Signed)
Person Under Monitoring Name: Audrey DibbleMichelle Montgomery  Location: [address] [place] [id number]   Infection Prevention Recommendations for Individuals Confirmed to have, or Being Evaluated for, 2019 Novel Coronavirus (COVID-19) Infection Who Receive Care at Home  Individuals who are confirmed to have, or are being evaluated for, COVID-19 should follow the prevention steps below until a healthcare provider or local or state health department says they can return to normal activities.  Stay home except to get medical care You should restrict activities outside your home, except for getting medical care. Do not go to work, school, or public areas, and do not use public transportation or taxis.  Call ahead before visiting your doctor Before your medical appointment, call the healthcare provider and tell them that you have, or are being evaluated for, COVID-19 infection. This will help the healthcare provider's office take steps to keep other people from getting infected. Ask your healthcare provider to call the local or state health department.  Monitor your symptoms Seek prompt medical attention if your illness is worsening (e.g., difficulty breathing). Before going to your medical appointment, call the healthcare provider and tell them that you have, or are being evaluated for, COVID-19 infection. Ask your healthcare provider to call the local or state health department.  Wear a facemask You should wear a facemask that covers your nose and mouth when you are in the same room with other people and when you visit a healthcare provider. People who live with or visit you should also wear a facemask while they are in the same room with you.  Separate yourself from other people in your home As much as possible, you should stay in a different room from other people in your home. Also, you should use a separate bathroom, if available.  Avoid sharing household items You should not  share dishes, drinking glasses, cups, eating utensils, towels, bedding, or other items with other people in your home. After using these items, you should wash them thoroughly with soap and water.  Cover your coughs and sneezes Cover your mouth and nose with a tissue when you cough or sneeze, or you can cough or sneeze into your sleeve. Throw used tissues in a lined trash can, and immediately wash your hands with soap and water for at least 20 seconds or use an alcohol-based hand rub.  Wash your [employer] Pacific Corporationhands Wash your hands often and thoroughly with soap and water for at least 20 seconds. You can use an alcohol-based hand sanitizer if soap and water are not available and if your hands are not visibly dirty. Avoid touching your eyes, nose, and mouth with unwashed hands.   Prevention Steps for Caregivers and Household Members of Individuals Confirmed to have, or Being Evaluated for, COVID-19 Infection Being Cared for in the Home  If you live with, or provide care at home for, a person confirmed to have, or being evaluated for, COVID-19 infection please follow these guidelines to prevent infection:  Follow healthcare provider's instructions Make sure that you understand and can help the patient follow any healthcare provider instructions for all care.  Provide for the patient's basic needs You should help the patient with basic needs in the home and provide support for getting groceries, prescriptions, and other personal needs.  Monitor the patient's symptoms If they are getting sicker, call his or her medical provider and tell them that the patient has, or is being evaluated for, COVID-19 infection. This will help the healthcare provider's office take  steps to keep other people from getting infected. Ask the healthcare provider to call the local or state health department.  Limit the number of people who have contact with the patient  If possible, have only one caregiver for the patient.   Other household members should stay in another home or place of residence. If this is not possible, they should stay  in another room, or be separated from the patient as much as possible. Use a separate bathroom, if available.  Restrict visitors who do not have an essential need to be in the home.  Keep older adults, very young children, and other sick people away from the patient Keep older adults, very young children, and those who have compromised immune systems or chronic health conditions away from the patient. This includes people with chronic heart, lung, or kidney conditions, diabetes, and cancer.  Ensure good ventilation Make sure that shared spaces in the home have good air flow, such as from an air conditioner or an opened window, weather permitting.  Wash your hands often  Wash your hands often and thoroughly with soap and water for at least 20 seconds. You can use an alcohol based hand sanitizer if soap and water are not available and if your hands are not visibly dirty.  Avoid touching your eyes, nose, and mouth with unwashed hands.  Use disposable paper towels to dry your hands. If not available, use dedicated cloth towels and replace them when they become wet.  Wear a facemask and gloves  Wear a disposable facemask at all times in the room and gloves when you touch or have contact with the patient's blood, body fluids, and/or secretions or excretions, such as sweat, saliva, sputum, nasal mucus, vomit, urine, or feces.  Ensure the mask fits over your nose and mouth tightly, and do not touch it during use.  Throw out disposable facemasks and gloves after using them. Do not reuse.  Wash your hands immediately after removing your facemask and gloves.  If your personal clothing becomes contaminated, carefully remove clothing and launder. Wash your hands after handling contaminated clothing.  Place all used disposable facemasks, gloves, and other waste in a lined container  before disposing them with other household waste.  Remove gloves and wash your hands immediately after handling these items.  Do not share dishes, glasses, or other household items with the patient  Avoid sharing household items. You should not share dishes, drinking glasses, cups, eating utensils, towels, bedding, or other items with a patient who is confirmed to have, or being evaluated for, COVID-19 infection.  After the person uses these items, you should wash them thoroughly with soap and water.  Wash laundry thoroughly  Immediately remove and wash clothes or bedding that have blood, body fluids, and/or secretions or excretions, such as sweat, saliva, sputum, nasal mucus, vomit, urine, or feces, on them.  Wear gloves when handling laundry from the patient.  Read and follow directions on labels of laundry or clothing items and detergent. In general, wash and dry with the warmest temperatures recommended on the label.  Clean all areas the individual has used often  Clean all touchable surfaces, such as counters, tabletops, doorknobs, bathroom fixtures, toilets, phones, keyboards, tablets, and bedside tables, every day. Also, clean any surfaces that may have blood, body fluids, and/or secretions or excretions on them.  Wear gloves when cleaning surfaces the patient has come in contact with.  Use a diluted bleach solution (e.g., dilute bleach with 1 part bleach  and 10 parts water) or a household disinfectant with a label that says EPA-registered for coronaviruses. To make a bleach solution at home, add 1 tablespoon of bleach to 1 quart (4 cups) of water. For a larger supply, add  cup of bleach to 1 gallon (16 cups) of water.  Read labels of cleaning products and follow recommendations provided on product labels. Labels contain instructions for safe and effective use of the cleaning product including precautions you should take when applying the product, such as wearing gloves or eye  protection and making sure you have good ventilation during use of the product.  Remove gloves and wash hands immediately after cleaning.  Monitor yourself for signs and symptoms of illness Caregivers and household members are considered close contacts, should monitor their health, and will be asked to limit movement outside of the home to the extent possible. Follow the monitoring steps for close contacts listed on the symptom monitoring form.   ? If you have additional questions, contact your local health department or call the epidemiologist on call at [phone] (available 24/7). ? This guidance is subject to change. For the most up-to-date guidance from [facility], please refer to their website: CocoaInvestor.tn information is directly available on the CDC website: DiscoHelp.si.html  Person Under Monitoring Name: Audrey Montgomery  Location: [address] [place] [address]   Record here the list of visitors to your home since you became ill with respiratory symptoms that led you to consult a health provider:  Visitor Name Date Time In Time Out Did this person come within 6 feet of you? Indicate Y or N Relationship to Person Under Monitoring Phone number Comments   ___/____/____ __:__ AM/PM __:__ AM/PM       ___/____/____ __:__ AM/PM __:__ AM/PM       ___/____/____ __:__ AM/PM __:__ AM/PM       ___/____/____ __:__ AM/PM __:__ AM/PM       ___/____/____ __:__ AM/PM __:__ AM/PM       ___/____/____ __:__ AM/PM __:__ AM/PM       ___/____/____ __:__ AM/PM __:__ AM/PM       ___/____/____ __:__ AM/PM __:__ AM/PM       ___/____/____ __:__ AM/PM __:__ AM/PM       ___/____/____ __:__ AM/PM __:__ AM/PM       ___/____/____ __:__ AM/PM __:__ AM/PM       ___/____/____ __:__ AM/PM __:__ AM/PM       ___/____/____ __:__ AM/PM __:__ AM/PM       ___/____/____ __:__  AM/PM __:__ AM/PM       Nedra Hai, Division of Public Health, Communicable Disease Branch    Source:CDC Reference to specific commercial products, manufacturers, companies, or trademarks does not constitute its endorsement or recommendation by the [employer], Department of Health and [employer], or Centers for Disease Control and Prevention.

## 2018-05-24 NOTE — Progress Notes (Signed)
Pt has diarrhea,cold and fever since Saturday. No know exposure.  Cough- non productive SOB with movement Fever at 103- intermittent taking-Tylenol  Has not been at in 2 days, works for  [employer]. 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

## 2018-05-24 NOTE — Progress Notes (Signed)
Virtual Visit via Telephone Note  I connected with Flossie Dibble on 05/24/18 at 2:52 p.m by telephone and verified that I am speaking with the correct person using two identifiers.   I discussed the limitations, risks, security and privacy concerns of performing an evaluation and management service by telephone and the availability of in person appointments. I also discussed with the patient that there may be a patient responsible charge related to this service. The patient expressed understanding and agreed to proceed.   History of Present Illness: Patient with history of hypertension, OA  Pt c/o acute illness x 3 days.  Symptoms include fever (up to 103), dry cough, congestion, little SOB "not a lot," hot and cold chills, diarrhea. No blood in stools.  No N/V/abdominal pains.  Initially she was having a BM Q 20-30 mins but now only with meals; no stomach pains. No recent Abx use.  Temp this a.m was 101 -"my bones, my muscles they ache."   She has not been to work for past 2 days. Works for [employer] No sure of any sick contacts at work No known COVID contacts, no travel outside state or internationally in past few wks Had flu shot  Lives with her older brother who had CVA.  He is independent in his ADLs.  He is not displaying similar symptoms. She has been taking Tylenol and started self-isolation since symptoms began  Observations/Objective: No direct observation done as this was a telephone encounter  Assessment and Plan: 1. Suspected Covid-19 Virus Infection -Advised patient that this is likely COVID-19 or some other acute viral illness. Recommend that she self isolate in one room of the house to prevent spreading this to her brother.  Went through [employer] guidelines about self-isolation and told her that I will mail this information to her. Patient advised to be seen in the emergency room if symptoms worsen especially shortness of breath or if diarrhea worsens or she  develops bloody stools.   Advised symptomatic treatment with Tylenol for fever and body aches, Pepto-Bismol as needed for diarrhea, and over-the-counter cough syrup if needed. Recommended patient push fluids.  BRAT diet discussed and encouraged.   Recommend that she remain out of work for at least 2 weeks and until she has remained afebrile for at least 72 hours and not having to take medications for symptoms and at least 7 days have passed since symptoms first. -Plan to follow-up with her via telephone in 2 to 3 days.  Follow Up Instructions:    I discussed the assessment and treatment plan with the patient. The patient was provided an opportunity to ask questions and all were answered. The patient agreed with the plan and demonstrated an understanding of the instructions.   The patient was advised to call back or seek an in-person evaluation if the symptoms worsen or if the condition fails to improve as anticipated.  I provided 14 minutes of non-face-to-face time during this encounter.   Jonah Blue, MD

## 2018-05-26 ENCOUNTER — Other Ambulatory Visit: Payer: Self-pay

## 2018-05-26 ENCOUNTER — Ambulatory Visit: Payer: Managed Care, Other (non HMO) | Attending: Internal Medicine | Admitting: Internal Medicine

## 2018-05-26 DIAGNOSIS — R0789 Other chest pain: Secondary | ICD-10-CM

## 2018-05-26 DIAGNOSIS — R6889 Other general symptoms and signs: Principal | ICD-10-CM

## 2018-05-26 DIAGNOSIS — R509 Fever, unspecified: Secondary | ICD-10-CM | POA: Diagnosis not present

## 2018-05-26 DIAGNOSIS — Z20822 Contact with and (suspected) exposure to covid-19: Secondary | ICD-10-CM

## 2018-05-26 DIAGNOSIS — R05 Cough: Secondary | ICD-10-CM | POA: Diagnosis not present

## 2018-05-26 DIAGNOSIS — R0602 Shortness of breath: Secondary | ICD-10-CM

## 2018-05-26 DIAGNOSIS — A0839 Other viral enteritis: Secondary | ICD-10-CM

## 2018-05-26 DIAGNOSIS — R51 Headache: Secondary | ICD-10-CM

## 2018-05-26 DIAGNOSIS — Z20828 Contact with and (suspected) exposure to other viral communicable diseases: Secondary | ICD-10-CM

## 2018-05-26 MED ORDER — BENZONATATE 100 MG PO CAPS: 100.0000 mg | ORAL_CAPSULE | Freq: Three times a day (TID) | ORAL | 0 refills | Status: DC | PRN

## 2018-05-26 MED ORDER — ALBUTEROL SULFATE HFA 108 (90 BASE) MCG/ACT IN AERS: 2.0000 | INHALATION_SPRAY | Freq: Four times a day (QID) | RESPIRATORY_TRACT | 2 refills | Status: DC | PRN

## 2018-05-26 NOTE — Progress Notes (Signed)
Virtual Visit via Telephone Note  I connected with Audrey Montgomery on 05/26/18 at 10:06 a.m by telephone  from my office and verified that I am speaking with the correct person using two identifiers. Pt is at home.  Only patient and myself participated in this telephone encounter.   I discussed the limitations, risks, security and privacy concerns of performing an evaluation and management service by telephone and the availability of in person appointments. I also discussed with the patient that there may be a patient responsible charge related to this service. The patient expressed understanding and agreed to proceed.   History of Present Illness: This is a 48-hour follow-up from my last telephone encounter 2 days ago.  Patient was diagnosed with suspected COVID-19 infection.  Patient has been following the self-isolation as discussed on last visit.  Temp this a.m was 99.6.  Highest temp in the past 24 hours was 101.6 that was last evening.  Last took Tylenol last evening. Since we last spoke, patient reports chest pain from frequent coughing.  She is bringing up a little bit of yellow phlegm.  She endorses shortness of breath which upon giving a scale of mild, moderate or severe she rated as mild.  She still has congestion and endorses hoarseness.  She is still having some diarrhea with stools that are soft to loose.  No blood in the stools.  No nausea or vomiting.  She endorses some abdominal cramps.  Still having body aches.  Some headache since last evening.  She is pushing fluids and using Pepto-Bismol.  She is not taking anything for the cough.  She has not received the packet that I sent out 2 days ago with work excuse letter and discharge instructions that mainly go over the CDC guidelines for self-isolation.    Observations/Objective: Patient has mild audible hoarseness and congestion  Assessment and Plan: 1. Suspected Covid-19 Virus Infection 2. Gastroenteritis due to COVID-19  virus -Advised patient to continue to self isolate.  We will send prescription to her pharmacy for a cough suppressant and albuterol inhaler for her to use as needed.  She tells me that she has used an inhaler before and knows how to use it. -Advised patient that if shortness of breath or headache get worse she should be seen in the emergency room at [facility]. I went through instructions again regarding self-isolation.  Advised patient that if she has to go out in public she should wear a mask and gloves.  Anyone who comes to the house should be made aware that she is in self-isolation and should wear a mask.  Discussed the importance of good handwashing and cleaning of surfaces in the home. -We will have her into another tele-visit with her PCP tomorrow or on Monday. - benzonatate (TESSALON) 100 MG capsule; Take 1 capsule (100 mg total) by mouth 3 (three) times daily as needed for cough.  Dispense: 30 capsule; Refill: 0 - albuterol (PROVENTIL HFA;VENTOLIN HFA) 108 (90 Base) MCG/ACT inhaler; Inhale 2 puffs into the lungs every 6 (six) hours as needed for wheezing or shortness of breath.  Dispense: 1 Inhaler; Refill: 2  Follow Up Instructions:   I discussed the assessment and treatment plan with the patient. The patient was provided an opportunity to ask questions and all were answered. The patient agreed with the plan and demonstrated an understanding of the instructions.   The patient was advised to call back or seek an in-person evaluation if the symptoms worsen or if  the condition fails to improve as anticipated.  I provided 14 minutes of non-face-to-face time during this encounter.   Jonah Blueeborah Johnson, MD

## 2018-05-26 NOTE — Progress Notes (Signed)
Pt states she has a headache  Pt states she is having chest pain(Pt states she thinks it's from coughing)  Pt states she is having difficulty breathing(Pt states it's not to bad)Pt states she sits down before it gets to bad

## 2018-05-27 ENCOUNTER — Other Ambulatory Visit: Payer: Self-pay

## 2018-05-27 ENCOUNTER — Encounter: Payer: Self-pay | Admitting: Nurse Practitioner

## 2018-05-27 ENCOUNTER — Ambulatory Visit: Payer: Managed Care, Other (non HMO) | Attending: Nurse Practitioner | Admitting: Nurse Practitioner

## 2018-05-27 DIAGNOSIS — Z20828 Contact with and (suspected) exposure to other viral communicable diseases: Secondary | ICD-10-CM

## 2018-05-27 DIAGNOSIS — R509 Fever, unspecified: Secondary | ICD-10-CM

## 2018-05-27 DIAGNOSIS — R6889 Other general symptoms and signs: Principal | ICD-10-CM

## 2018-05-27 DIAGNOSIS — R0602 Shortness of breath: Secondary | ICD-10-CM | POA: Diagnosis not present

## 2018-05-27 DIAGNOSIS — Z20822 Contact with and (suspected) exposure to covid-19: Secondary | ICD-10-CM

## 2018-05-27 DIAGNOSIS — R05 Cough: Secondary | ICD-10-CM | POA: Diagnosis not present

## 2018-05-27 DIAGNOSIS — R197 Diarrhea, unspecified: Secondary | ICD-10-CM

## 2018-05-27 NOTE — Progress Notes (Signed)
Virtual Visit via Telephone Note  I connected with Audrey Montgomery on 05/27/18 at 10:55 AM EDT by telephone and verified that I am speaking with the correct person using two identifiers.   I discussed the limitations, risks, security and privacy concerns of performing an evaluation and management service by telephone and the availability of in person appointments. I also discussed with the patient that there may be a patient responsible charge related to this service. The patient expressed understanding and agreed to proceed.   History of Present Illness: Patient has been closely followed through telemedicine assessments for suspected COVID -19. This is a 24 hour follow up phone call from the last encounter yesterday.  1. Patient endorses self isolation 2. Taking tylenol for fevers. Tmax 101 overnight with most recent reading 99.6.  Reports she stopped smoking 4-5 months ago.  3. Currently taking tessalon Perles and using albuterol inhaler as needed.  Cough is productive of yellow phlegm. Still with mild shortness of breath and midsternal chest pain with coughing. She is using a pillow against her chest to help reduce chest pain when she has to cough which does seem to help. Has had 4 bottles of water so far this morning. Feels tired but is able to ambulate around her room.  4.States she feels a little better today compared to yesterday.  Lives with her brother who had a stroke last June but gets around well and is able to take care of himself. She has self isolated herself in her bedroom and does not interact with him.  5. Stools are still loose but have not worsened. . Denies melena or hematochezia. Taking pepto. 6. Still with myalgias and breaking out into "cold sweats" at times.   Still waiting on work note for absence excusal to come in the mail as well as the handouts printed by previous provider for COVID 19 self isolation instructions which we also went over  today.   Observations/Objective: Oriented x3. Voice sounds hoarse.   Assessment and Plan:  Suspected Covid-19 Virus Infection Continue Tessalon perles and albuterol inhaler.    Follow Up Instructions: Continue self isolation.  She was advised if shortness of breath or headaches worsen she should report to the emergency room at [facility] hospital immediately.  We discussed the importance of good hand washing technique. I encouraged her to stay home however if there is any emergency and she needs to go out she should wear a mask and gloves. Her brother should also be wearing a mask as he lives with her.  She verbalized understanding.   Will follow up with telemedicine visit in 3 days.    I discussed the assessment and treatment plan with the patient. The patient was provided an opportunity to ask questions and all were answered. The patient agreed with the plan and demonstrated an understanding of the instructions.   The patient was advised to call back or seek an in-person evaluation if the symptoms worsen or if the condition fails to improve as anticipated.  I provided 10 minutes of non-face-to-face time during this encounter.   Claiborne Rigg, NP

## 2018-05-30 ENCOUNTER — Encounter (HOSPITAL_COMMUNITY): Payer: Self-pay | Admitting: Emergency Medicine

## 2018-05-30 ENCOUNTER — Emergency Department (HOSPITAL_COMMUNITY)
Admission: EM | Admit: 2018-05-30 | Discharge: 2018-05-30 | Disposition: A | Discharge status: Home / Self Care | Payer: Managed Care, Other (non HMO) | Attending: Emergency Medicine | Admitting: Emergency Medicine

## 2018-05-30 ENCOUNTER — Other Ambulatory Visit: Payer: Self-pay

## 2018-05-30 ENCOUNTER — Ambulatory Visit (HOSPITAL_BASED_OUTPATIENT_CLINIC_OR_DEPARTMENT_OTHER): Payer: Managed Care, Other (non HMO) | Admitting: Nurse Practitioner

## 2018-05-30 ENCOUNTER — Encounter: Payer: Self-pay | Admitting: Nurse Practitioner

## 2018-05-30 DIAGNOSIS — Z79899 Other long term (current) drug therapy: Secondary | ICD-10-CM | POA: Diagnosis not present

## 2018-05-30 DIAGNOSIS — Z20822 Contact with and (suspected) exposure to covid-19: Secondary | ICD-10-CM

## 2018-05-30 DIAGNOSIS — I10 Essential (primary) hypertension: Secondary | ICD-10-CM | POA: Diagnosis not present

## 2018-05-30 DIAGNOSIS — R062 Wheezing: Secondary | ICD-10-CM | POA: Diagnosis present

## 2018-05-30 DIAGNOSIS — B9789 Other viral agents as the cause of diseases classified elsewhere: Secondary | ICD-10-CM

## 2018-05-30 DIAGNOSIS — Z87891 Personal history of nicotine dependence: Secondary | ICD-10-CM | POA: Insufficient documentation

## 2018-05-30 DIAGNOSIS — R51 Headache: Secondary | ICD-10-CM | POA: Diagnosis not present

## 2018-05-30 DIAGNOSIS — R6889 Other general symptoms and signs: Secondary | ICD-10-CM

## 2018-05-30 DIAGNOSIS — R05 Cough: Secondary | ICD-10-CM | POA: Insufficient documentation

## 2018-05-30 DIAGNOSIS — J069 Acute upper respiratory infection, unspecified: Secondary | ICD-10-CM | POA: Insufficient documentation

## 2018-05-30 DIAGNOSIS — Z20828 Contact with and (suspected) exposure to other viral communicable diseases: Secondary | ICD-10-CM

## 2018-05-30 DIAGNOSIS — R0602 Shortness of breath: Secondary | ICD-10-CM

## 2018-05-30 DIAGNOSIS — R509 Fever, unspecified: Secondary | ICD-10-CM

## 2018-05-30 NOTE — ED Triage Notes (Signed)
Pt's PCP had her on quarantine for over week. Pt talked to PCP today on phone and they advised pt to come to ED for further evaluation due to continued cough, sore throat, but they heard patient wheezing while on phone as to why sent to ED. Pt reports feeling better than has been and has not had fevers for few days.

## 2018-05-30 NOTE — Progress Notes (Signed)
Virtual Visit via Telephone Note  I connected with Audrey Montgomery on 05/30/18 at  9:10 AM EDT by telephone and verified that I am speaking with the correct person using two identifiers.   I discussed the limitations, risks, security and privacy concerns of performing an evaluation and management service by telephone and the availability of in person appointments. I also discussed with the patient that there may be a patient responsible charge related to this service. The patient expressed understanding and agreed to proceed.   History of Present Illness: We have been following Audrey Montgomery via telehealth since 05-24-2018. Symptom onset around 05-21-2018.  Tmax today 98-99. However have been as high as 103.  Cough and headaches have gotten worse today despite compliance with albuterol inhaler, Tessalon, pepto bismol, pushing fluids and following self quarantine guidelines.  Headaches 8/10. Persistent despite taking tylenol.  Productive cough with thick yellow sputum and difficulty clearing.  Chest pain worsening however she reports chest pain correlated to her cough.   Positive for Wheezing. Diarrhea has improved.   Observations/Objective: She has audible wheezes which can be heard over the phone. Voice is hoarse.   Assessment and Plan: [name] was seen today for follow-up.  Diagnoses and all orders for this visit:  Suspected Covid-19 Virus Infection Patient was instructed to go to [facility] ED. She will need to be evaluated.  I have called report and spoken to [facility]    Follow Up Instructions: Return in about 2 days (around 06/01/2018).  I discussed the assessment and treatment plan with the patient. The patient was provided an opportunity to ask questions and all were answered. The patient agreed with the plan and demonstrated an understanding of the instructions.   The patient was advised to call back or seek an in-person evaluation if the symptoms worsen or if the condition fails to  improve as anticipated.  I provided 12 minutes of non-face-to-face time during this encounter.   Claiborne Rigg, NP

## 2018-05-30 NOTE — ED Provider Notes (Addendum)
[facility]-EMERGENCY DEPT Provider Note   CSN: [id number] Arrival date & time: 05/30/18  [id number]    History   Chief Complaint Chief Complaint  Patient presents with  . Wheezing  . Cough    HPI Audrey Montgomery is a [age] y.o. female.     The history is provided by the patient and medical records. No language interpreter was used.  Wheezing  Associated symptoms: cough   Cough  Associated symptoms: wheezing    Audrey Montgomery is a 58 y.o. female who presents to the Emergency Department complaining of cough/wheeze. She presents to the emergency department complaining of cough and wheeze. She began feeling ill a week ago Saturday with fevers, body aches, fatigue, cough, diarrhea, dyspnea on exertion. Overall her fevers have resolved. She is experiencing persistent cough that is worse at night and wakes herfrom sleep. She has sore throat when she coughs. She denies any hemoptysis. Her diarrhea is overall resolved. She does have mild associated abdominal discomfort. She talked to her PCP today, who thought she was wheezing when they were talking on the phone and recommended evaluation in the emergency department. She does have dyspnea on exertion and fatigue but overall this is improved since her illness began. Past Medical History:  Diagnosis Date  . Cataract    Both Eyes.  . Hypertension     Patient Active Problem List   Diagnosis Date Noted  . Viral upper respiratory tract infection 10/29/2015  . Elevated CK 10/03/2015  . Osteoarthritis of multiple joints 10/03/2015  . 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 Essential hypertension 06/18/2015  . Dizziness 07/16/2011  . Headache 07/16/2011  . Night sweats 07/16/2011  . Numbness 07/16/2011  . Weakness 07/16/2011    Past Surgical History:  Procedure Laterality Date  . APPENDECTOMY       OB History   No obstetric history on file.      Home Medications    Prior to Admission medications    Medication Sig Start Date End Date Taking? Authorizing Provider  albuterol (PROVENTIL HFA;VENTOLIN HFA) 108 (90 Base) MCG/ACT inhaler Inhale 2 puffs into the lungs every 6 (six) hours as needed for wheezing or shortness of breath. 05/26/18   Marcine MatarJohnson, Deborah B, MD  amLODipine (NORVASC) 5 MG tablet Take 1 tablet (5 mg total) by mouth daily. 04/27/18   Claiborne RiggFleming, Zelda W, NP  benzonatate (TESSALON) 100 MG capsule Take 1 capsule (100 mg total) by mouth 3 (three) times daily as needed for cough. 05/26/18   Marcine MatarJohnson, Deborah B, MD  diclofenac sodium (VOLTAREN) 1 % GEL Apply 2 g topically 4 (four) times daily. Rub into affected area of foot 2 to 4 times daily 02/21/18   Vivi BarrackWagoner, Matthew R, DPM  lisinopril (PRINIVIL,ZESTRIL) 20 MG tablet Take 1 tablet (20 mg total) by mouth daily. 01/24/18   Claiborne RiggFleming, Zelda W, NP  varenicline (CHANTIX CONTINUING MONTH PAK) 1 MG tablet Take 1 tablet (1 mg total) by mouth 2 (two) times daily. Patient not taking: Reported on 05/30/2018 12/30/17   Hoy RegisterNewlin, Enobong, MD    Family History Family History  Problem Relation Age of Onset  . Stroke Brother     Social History Social History   Tobacco Use  . Smoking status: Former Smoker    Packs/day: 0.50    Types: Cigarettes    Last attempt to quit: 12/25/2017    Years since quitting: 0.4  . Smokeless tobacco: Never Used  . Tobacco comment: Pt. stated she stopped smoking for a  month. 04/27/2018  Substance Use Topics  . Alcohol use: Not Currently  . Drug use: No     Allergies   Patient has no known allergies.   Review of Systems Review of Systems  Respiratory: Positive for cough and wheezing.   All other systems reviewed and are negative.    Physical Exam Updated Vital Signs BP (!) 141/100 (BP Location: Left Arm)   Pulse 89   Temp 98 F (36.7 C) (Oral)   Resp 19   LMP  (LMP Unknown)   SpO2 100%   Physical Exam Vitals signs and nursing note reviewed.  Constitutional:      Appearance: She is well-developed.   HENT:     Head: Normocephalic and atraumatic.     Mouth/Throat:     Comments: Mild erythema in the posterior oropharynx without any exudate or edema Cardiovascular:     Rate and Rhythm: Normal rate and regular rhythm.     Heart sounds: No murmur.  Pulmonary:     Effort: Pulmonary effort is normal. No respiratory distress.     Breath sounds: Normal breath sounds. No stridor. No wheezing.  Abdominal:     Palpations: Abdomen is soft.     Tenderness: There is no abdominal tenderness. There is no guarding or rebound.  Musculoskeletal:        General: No swelling or tenderness.  Skin:    General: Skin is warm and dry.  Neurological:     Mental Status: She is alert and oriented to person, place, and time.  Psychiatric:        Behavior: Behavior normal.      ED Treatments / Results  Labs (all labs ordered are listed, but only abnormal results are displayed) Labs Reviewed - No data to display  EKG None  Radiology No results found.  Procedures Procedures (including critical care time)  Medications Ordered in ED Medications - No data to display   Initial Impression / Assessment and Plan / ED Course  I have reviewed the triage vital signs and the nursing notes.  Pertinent labs & imaging results that were available during my care of the patient were reviewed by me and considered in my medical decision making (see chart for details).       Patient here for evaluation of cough. She is in no acute distress on evaluation, SpO2 100% on room air. Clinical exam is not consistent with pneumonia, CHF, pneumothorax, sepsis. She does have a slightly hoarse voice with no stridor or concern for airway obstruction. Discussed with patient continuing her current home care for likely COVID infection that at this point appears to be improving. Discussed importance of return precautions if she has worsening symptoms.  Audrey Montgomery was evaluated in Emergency Department on 05/30/2018 for the  symptoms described in the history of present illness. She was evaluated in the context of the global COVID-19 pandemic, which necessitated consideration that the patient might be at risk for infection with the SARS-CoV-2 virus that causes COVID-19. Institutional protocols and algorithms that pertain to the evaluation of patients at risk for COVID-19 are in a state of rapid change based on information released by regulatory bodies including the CDC and federal and state organizations. These policies and algorithms were followed during the patient's care in the ED.   Final Clinical Impressions(s) / ED Diagnoses   Final diagnoses:  Suspected Covid-19 Virus Infection  Viral URI with cough    ED Discharge Orders    None  Tilden Fossa, MD 05/30/18 1016    Tilden Fossa, MD 05/30/18 1016

## 2018-05-30 NOTE — ED Notes (Signed)
Bed: HA57 Expected date: 05/30/18 Expected time: 2:42 AM Means of arrival:  Comments: POV COVID?

## 2018-06-01 ENCOUNTER — Ambulatory Visit: Payer: Managed Care, Other (non HMO) | Admitting: Nurse Practitioner

## 2018-06-01 ENCOUNTER — Encounter (INDEPENDENT_AMBULATORY_CARE_PROVIDER_SITE_OTHER): Payer: Self-pay

## 2018-06-01 ENCOUNTER — Ambulatory Visit: Payer: Managed Care, Other (non HMO) | Attending: Nurse Practitioner | Admitting: Nurse Practitioner

## 2018-06-01 ENCOUNTER — Encounter: Payer: Self-pay | Admitting: Nurse Practitioner

## 2018-06-01 ENCOUNTER — Other Ambulatory Visit: Payer: Self-pay

## 2018-06-01 DIAGNOSIS — Z20828 Contact with and (suspected) exposure to other viral communicable diseases: Secondary | ICD-10-CM

## 2018-06-01 DIAGNOSIS — R0602 Shortness of breath: Secondary | ICD-10-CM

## 2018-06-01 DIAGNOSIS — R509 Fever, unspecified: Secondary | ICD-10-CM | POA: Diagnosis not present

## 2018-06-01 DIAGNOSIS — R51 Headache: Secondary | ICD-10-CM | POA: Diagnosis not present

## 2018-06-01 DIAGNOSIS — R6889 Other general symptoms and signs: Principal | ICD-10-CM

## 2018-06-01 DIAGNOSIS — R05 Cough: Secondary | ICD-10-CM | POA: Diagnosis not present

## 2018-06-01 DIAGNOSIS — Z20822 Contact with and (suspected) exposure to covid-19: Secondary | ICD-10-CM

## 2018-06-01 MED ORDER — PSEUDOEPH-BROMPHEN-DM 30-2-10 MG/5ML PO SYRP: 5.0000 mL | ORAL_SOLUTION | Freq: Four times a day (QID) | ORAL | 0 refills | Status: AC | PRN

## 2018-06-01 NOTE — Progress Notes (Signed)
Virtual Visit via Telephone Note  I connected with Audrey Montgomery on 06/02/18  at  11:00 AM EDT  EDT by telephone and verified that I am speaking with the correct person using two identifiers.   Consent I discussed the limitations, risks, security and privacy concerns of performing an evaluation and management service by telephone and the availability of in person appointments. I also discussed with the patient that there may be a patient responsible charge related to this service. The patient expressed understanding and agreed to proceed.   Location of Patient: Private Residence   Location of Provider: [facility]    Persons participating in Telemedicine visit: Bertram Denver FNP-BC [facility] CMA Audrey Montgomery    History of Present Illness: Telemedicine call for follow up to suspected COVID . DaytimeTmax 98-99 Overnight 100 Tmax. Feeling better today. Not sure if the tessalon helps or not but continues to take. I sent her to the ER on 05-30-2018 after having a telemedicine call which resulted in me being able to hear audible wheezing over the phone. She was evaluated and sent home same day. Sats 100%.  Diarrhea almost completely resolved. Still with shortness of breath, congestion, and productive cough however improved.     Observations/Objective: Awake and Oriented x3 Voice is still hoarse.   Review of Systems  Constitutional: Positive for fever and malaise/fatigue. Negative for weight loss.  HENT: Positive for congestion. Negative for nosebleeds.   Eyes: Negative.  Negative for blurred vision, double vision and photophobia.  Respiratory: Positive for cough, sputum production and shortness of breath. Negative for hemoptysis and wheezing.   Cardiovascular: Negative.  Negative for chest pain, palpitations and leg swelling.  Gastrointestinal: Positive for diarrhea. Negative for abdominal pain, blood in stool, constipation, heartburn, melena,  nausea and vomiting.  Musculoskeletal: Negative.  Negative for myalgias.  Neurological: Positive for headaches (mild and intermittent). Negative for dizziness, focal weakness and seizures.  Psychiatric/Behavioral: Negative.  Negative for suicidal ideas.      Assessment and Plan: Diagnoses and all orders for this visit:  Suspected Covid-19 Virus Infection -     MyChart COVID-19 home monitoring program; Future -     Temperature monitoring; Future -     brompheniramine-pseudoephedrine-DM 30-2-10 MG/5ML syrup; Take 5 mLs by mouth 4 (four) times daily as needed for up to 14 days.     Follow Up Instructions Return in about 3 days (around 06/04/2018).     I discussed the assessment and treatment plan with the patient. The patient was provided an opportunity to ask questions and all were answered. The patient agreed with the plan and demonstrated an understanding of the instructions.   The patient was advised to call back or seek an in-person evaluation if the symptoms worsen or if the condition fails to improve as anticipated.  I provided 6 minutes of non-face-to-face time during this encounter including median intraservice time, reviewing previous notes, labs, imaging, medications and explaining diagnosis and management.  Claiborne Rigg, FNP-BC

## 2018-06-02 ENCOUNTER — Encounter (INDEPENDENT_AMBULATORY_CARE_PROVIDER_SITE_OTHER): Payer: Self-pay

## 2018-06-02 ENCOUNTER — Telehealth: Payer: Self-pay | Admitting: *Deleted

## 2018-06-02 ENCOUNTER — Encounter: Payer: Self-pay | Admitting: Nurse Practitioner

## 2018-06-02 NOTE — Telephone Encounter (Signed)
Charted in error.

## 2018-06-03 ENCOUNTER — Encounter (INDEPENDENT_AMBULATORY_CARE_PROVIDER_SITE_OTHER): Payer: Self-pay

## 2018-06-04 ENCOUNTER — Encounter (INDEPENDENT_AMBULATORY_CARE_PROVIDER_SITE_OTHER): Payer: Self-pay

## 2018-06-05 ENCOUNTER — Encounter: Payer: Self-pay | Admitting: Nurse Practitioner

## 2018-06-05 ENCOUNTER — Encounter (INDEPENDENT_AMBULATORY_CARE_PROVIDER_SITE_OTHER): Payer: Self-pay

## 2018-06-05 ENCOUNTER — Other Ambulatory Visit: Payer: Self-pay | Admitting: Nurse Practitioner

## 2018-06-05 MED ORDER — LOPERAMIDE HCL 2 MG PO TABS: 2.0000 mg | ORAL_TABLET | Freq: Four times a day (QID) | ORAL | 0 refills | Status: DC | PRN

## 2018-06-06 ENCOUNTER — Encounter: Payer: Self-pay | Admitting: Nurse Practitioner

## 2018-06-06 ENCOUNTER — Telehealth: Payer: Self-pay | Admitting: Nurse Practitioner

## 2018-06-06 ENCOUNTER — Encounter (INDEPENDENT_AMBULATORY_CARE_PROVIDER_SITE_OTHER): Payer: Self-pay

## 2018-06-06 NOTE — Telephone Encounter (Signed)
Patient dropped off employer paperwork. Will be put in PCP box

## 2018-06-06 NOTE — Telephone Encounter (Signed)
Will inform PCP. CMA will contact patient when it is ready for pick up.

## 2018-06-07 ENCOUNTER — Telehealth: Payer: Self-pay | Admitting: *Deleted

## 2018-06-07 ENCOUNTER — Telehealth: Payer: Self-pay | Admitting: Nurse Practitioner

## 2018-06-07 ENCOUNTER — Encounter (INDEPENDENT_AMBULATORY_CARE_PROVIDER_SITE_OTHER): Payer: Self-pay

## 2018-06-07 NOTE — Telephone Encounter (Signed)
New Message  Audrey Montgomery Shorts from [employer] is calling because the pt states she was tested positive for Covid 19 but the documentation she provided did not show where the pt had been tested and he is wanting to confirm. Please call

## 2018-06-07 NOTE — Telephone Encounter (Signed)
Contacted pt regarding her my chart companion response of decreased appetitie; the pt says that she is not eating because of diarrhea, "everything runs through me"; recommendations made as follows: Drink more fluids, at least 8-10 cups daily. One cup equals 8 oz (240 ml). * WATER: For mild to moderate diarrhea, water is often the best liquid to drink. You should also eat some salty foods (e.g., potato chips, pretzels, saltine crackers). This is important to make sure you are getting enough salt, sugars, and fluids to meet your body's needs. * SPORTS DRINKS: You can also drink a sports drinks (e.g., Gatorade, Powerade) to help treat and prevent dehydration. For it to work best, mix it half and half with water. * Avoid caffeinated beverages (Reason: caffeine is mildly dehydrating). * Avoid alcohol beverages (beer, wine, hard liquor).    3: FOOD AND NUTRITION DURING MILD-MODERATE DIARRHEA * Maintaining some food intake during episodes of diarrhea is important. * Begin with boiled starches / cereals (e.g., potatoes, rice, noodles, wheat, oats) with a small amount of salt to taste. * Other foods that are OK include: bananas, yogurt, crackers, soup.  The pt verbalizes understanding, and will continue to monitor her symptoms.

## 2018-06-07 NOTE — Telephone Encounter (Signed)
Will route to PCP 

## 2018-06-08 ENCOUNTER — Encounter (INDEPENDENT_AMBULATORY_CARE_PROVIDER_SITE_OTHER): Payer: Self-pay

## 2018-06-08 NOTE — Telephone Encounter (Signed)
Called and LVM

## 2018-06-09 ENCOUNTER — Encounter (INDEPENDENT_AMBULATORY_CARE_PROVIDER_SITE_OTHER): Payer: Self-pay

## 2018-06-10 ENCOUNTER — Encounter (INDEPENDENT_AMBULATORY_CARE_PROVIDER_SITE_OTHER): Payer: Self-pay

## 2018-06-11 ENCOUNTER — Encounter (INDEPENDENT_AMBULATORY_CARE_PROVIDER_SITE_OTHER): Payer: Self-pay

## 2018-06-12 ENCOUNTER — Encounter (INDEPENDENT_AMBULATORY_CARE_PROVIDER_SITE_OTHER): Payer: Self-pay

## 2018-06-12 ENCOUNTER — Encounter: Payer: Self-pay | Admitting: Nurse Practitioner

## 2018-06-13 ENCOUNTER — Encounter: Payer: Managed Care, Other (non HMO) | Admitting: Physical Medicine and Rehabilitation

## 2018-06-13 ENCOUNTER — Encounter (INDEPENDENT_AMBULATORY_CARE_PROVIDER_SITE_OTHER): Payer: Self-pay

## 2018-06-13 ENCOUNTER — Other Ambulatory Visit: Payer: Self-pay

## 2018-06-14 ENCOUNTER — Encounter (INDEPENDENT_AMBULATORY_CARE_PROVIDER_SITE_OTHER): Payer: Self-pay

## 2018-06-16 ENCOUNTER — Encounter: Payer: Self-pay | Admitting: Nurse Practitioner

## 2018-06-17 ENCOUNTER — Encounter: Payer: Self-pay | Admitting: Nurse Practitioner

## 2018-06-24 ENCOUNTER — Encounter: Payer: Self-pay | Admitting: Nurse Practitioner

## 2018-06-27 ENCOUNTER — Other Ambulatory Visit: Payer: Self-pay | Admitting: Nurse Practitioner

## 2018-07-06 ENCOUNTER — Encounter: Payer: Self-pay | Admitting: Nurse Practitioner

## 2018-07-06 ENCOUNTER — Telehealth: Payer: Self-pay | Admitting: *Deleted

## 2018-07-06 DIAGNOSIS — Z20822 Contact with and (suspected) exposure to covid-19: Secondary | ICD-10-CM

## 2018-07-06 NOTE — Telephone Encounter (Signed)
Patient called and testing scheduled at the [facility] site on 07/07/18. Pt given physical address of testing site and advised to wear a mask and remain in car for appt. Advised pt that test results should be back between 48-72 hours but may also be longer. Pt notified that once results are received she would be contacted and made aware. Understanding verbalized.

## 2018-07-06 NOTE — Telephone Encounter (Signed)
-----   Message from Claiborne Rigg, NP sent at 07/06/2018  5:32 PM EDT ----- Regarding: COVID TESTING [employer]NA[employer] Audrey Montgomery  Group Number [id number]  ID [id number]      REASON FOR COVID TESTING Shortness of breath with minimal exertion Fevers  Diarrhea Dry Cough Fatigue A few employees at her job have been diagnosed with COVID She was instructed to quarantine last month with more severe symptoms.  She self quarantined and returned to work a week ago and now has re occurring symptoms.

## 2018-07-07 ENCOUNTER — Other Ambulatory Visit: Payer: Managed Care, Other (non HMO)

## 2018-07-07 ENCOUNTER — Encounter: Payer: Self-pay | Admitting: Nurse Practitioner

## 2018-07-07 DIAGNOSIS — Z20822 Contact with and (suspected) exposure to covid-19: Secondary | ICD-10-CM

## 2018-07-08 ENCOUNTER — Encounter: Payer: Managed Care, Other (non HMO) | Admitting: Physical Medicine and Rehabilitation

## 2018-07-08 LAB — NOVEL CORONAVIRUS, NAA: SARS-CoV-2, NAA: NOT DETECTED

## 2018-07-08 NOTE — Telephone Encounter (Signed)
Return to work note requested via [employer]

## 2018-07-13 ENCOUNTER — Encounter: Payer: Self-pay | Admitting: Nurse Practitioner

## 2018-07-13 NOTE — Telephone Encounter (Signed)
Will route to PCP 

## 2018-07-13 NOTE — Telephone Encounter (Signed)
Patient is requesting a note back to work. Please follow up with PCP and via mychart with patient for the request

## 2018-07-14 ENCOUNTER — Encounter: Payer: Self-pay | Admitting: Nurse Practitioner

## 2018-07-15 ENCOUNTER — Encounter: Payer: Self-pay | Admitting: Nurse Practitioner

## 2018-07-20 ENCOUNTER — Ambulatory Visit (INDEPENDENT_AMBULATORY_CARE_PROVIDER_SITE_OTHER): Payer: Managed Care, Other (non HMO) | Admitting: Physical Medicine and Rehabilitation

## 2018-07-20 ENCOUNTER — Encounter: Payer: Self-pay | Admitting: Physical Medicine and Rehabilitation

## 2018-07-20 ENCOUNTER — Other Ambulatory Visit: Payer: Self-pay

## 2018-07-20 DIAGNOSIS — R202 Paresthesia of skin: Secondary | ICD-10-CM | POA: Diagnosis not present

## 2018-07-20 NOTE — Progress Notes (Signed)
Audrey DibbleMichelle Wisehart - 58 y.o. female MRN [id number]  Date of birth: 1960-10-11  Office Visit Note: Visit Date: 07/20/2018 PCP: Claiborne RiggFleming, Zelda W, NP Referred by: Claiborne RiggFleming, Zelda W, NP  Subjective: Chief Complaint  Patient presents with  . Right Hand - Numbness  . Left Hand - Numbness   HPI:  Audrey Montgomery is a 58 y.o. female who comes in today At the request of Dr. Glee ArvinMichael Xu for electrodiagnostic study of both upper limbs.  She is right-hand dominant and reports numbness and tingling in both hands particularly while at work.  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  She reports a cyst on the left wrist.  This is a ganglion cyst on the volar left wrist.  She reports pain in the forearm up to that spot as well as some elbow pain.  She does report that the left side is worse than the right at least today.  She reports that all the fingers are same globally and in a nondermatomal distribution.  She has a history of prior ganglion cyst removal on her foot and also a prior history of right carpal tunnel release surgery approximately 15 years ago.  She has been using splints and medication without relief.       ROS Otherwise per HPI.  Assessment & Plan: Visit Diagnoses:  1. Paresthesia of skin     Plan: Impression: Essentially NORMAL electrodiagnostic study of both upper limbs.    There is no significant electrodiagnostic evidence of nerve entrapment, brachial plexopathy, cervical radiculopathy or generalized peripheral neuropathy.    As you know, purely sensory or demyelinating radiculopathies and chemical radiculitis may not be detected with this particular electrodiagnostic study.  This electrodiagnostic study cannot rule out small fiber polyneuropathy and dysesthesias from central pain sensitization syndromes such as fibromyalgia.  Myotomal referral pain from trigger  points is also not excluded.   Recommendations: 1.  Follow-up with referring physician. 2.  Continue current management of symptoms.  Meds & Orders: No orders of the defined types were placed in this encounter.   Orders Placed This Encounter  Procedures  . NCV with EMG (electromyography)    Follow-up: Return for  Glee ArvinMichael Xu, M.D..   Procedures: No procedures performed  EMG & NCV Findings: Evaluation of the left ulnar sensory nerve showed prolonged distal peak latency (3.8 ms) and decreased conduction velocity (Wrist-5th Digit, 37 m/s).  All remaining nerves (as indicated in the following tables) were within normal limits.  Left vs. Right side comparison data for the ulnar sensory nerve indicates abnormal L-R latency difference (0.5 ms).  All remaining left vs. right side differences were within normal limits.    All examined muscles (as indicated in the following table) showed no evidence of electrical instability.    Impression: Essentially NORMAL electrodiagnostic study of both upper limbs.    There is no significant electrodiagnostic evidence of nerve entrapment, brachial plexopathy, cervical radiculopathy or generalized peripheral neuropathy.    As you know, purely sensory or demyelinating radiculopathies and chemical radiculitis may not be detected with this particular electrodiagnostic study.  This electrodiagnostic study cannot rule out small fiber polyneuropathy and dysesthesias from central pain sensitization syndromes such as fibromyalgia.  Myotomal referral pain from trigger points is also not excluded.   Recommendations: 1.  Follow-up with referring physician. 2.  Continue current management of symptoms.  ___________________________ Naaman PlummerFred Sinahi Knights  FAAPMR Board Certified, American Board of Physical Medicine and Rehabilitation    Nerve Conduction Studies Anti Sensory Summary Table   Stim Site NR Peak (ms) Norm Peak (ms) P-T Amp (V) Norm P-T Amp Site1 Site2 Delta-P  (ms) Dist (cm) Vel (m/s) Norm Vel (m/s)  Left Median Acr Palm Anti Sensory (2nd Digit)  31.7C  Wrist    3.4 <3.6 21.3 >10 Wrist Palm 1.5 0.0    Palm    1.9 <2.0 17.2         Right Median Acr Palm Anti Sensory (2nd Digit)  32.5C  Wrist    3.4 <3.6 21.4 >10 Wrist Palm 1.4 0.0    Palm    2.0 <2.0 26.7         Left Radial Anti Sensory (Base 1st Digit)  31.6C  Wrist    2.3 <3.1 22.8  Wrist Base 1st Digit 2.3 0.0    Right Radial Anti Sensory (Base 1st Digit)  32.1C  Wrist    2.2 <3.1 23.8  Wrist Base 1st Digit 2.2 0.0    Left Ulnar Anti Sensory (5th Digit)  31.9C  Wrist    *3.8 <3.7 19.0 >15.0 Wrist 5th Digit 3.8 14.0 *37 >38  Right Ulnar Anti Sensory (5th Digit)  32.6C  Wrist    3.3 <3.7 15.9 >15.0 Wrist 5th Digit 3.3 14.0 42 >38   Motor Summary Table   Stim Site NR Onset (ms) Norm Onset (ms) O-P Amp (mV) Norm O-P Amp Site1 Site2 Delta-0 (ms) Dist (cm) Vel (m/s) Norm Vel (m/s)  Left Median Motor (Abd Poll Brev)  31.7C  Wrist    3.4 <4.2 10.3 >5 Elbow Wrist 3.7 20.5 55 >50  Elbow    7.1  9.6         Right Median Motor (Abd Poll Brev)  32.1C  Wrist    3.3 <4.2 9.3 >5 Elbow Wrist 4.0 22.0 55 >50  Elbow    7.3  8.7         Left Ulnar Motor (Abd Dig Min)  31.8C  Wrist    2.9 <4.2 7.6 >3 B Elbow Wrist 3.3 20.0 61 >53  B Elbow    6.2  7.8  A Elbow B Elbow 1.2 9.5 79 >53  A Elbow    7.4  7.5         Right Ulnar Motor (Abd Dig Min)  32.1C  Wrist    2.7 <4.2 8.2 >3 B Elbow Wrist 3.8 20.5 54 >53  B Elbow    6.5  8.1  A Elbow B Elbow 1.2 10.0 83 >53  A Elbow    7.7  7.9          EMG   Side Muscle Nerve Root Ins Act Fibs Psw Amp Dur Poly Recrt Int Dennie BiblePat Comment  Left Abd Poll Brev Median C8-T1 Nml Nml Nml Nml Nml 0 Nml Nml   Left 1stDorInt Ulnar C8-T1 Nml Nml Nml Nml Nml 0 Nml Nml   Left PronatorTeres Median C6-7 Nml Nml Nml Nml Nml 0 Nml Nml   Left Biceps Musculocut C5-6 Nml Nml Nml Nml Nml 0 Nml Nml   Left Deltoid Axillary C5-6 Nml Nml Nml Nml Nml 0 Nml Nml     Nerve Conduction  Studies Anti Sensory Left/Right Comparison   Stim Site L Lat (ms) R Lat (ms) L-R Lat (ms) L Amp (V) R Amp (V) L-R Amp (%) Site1 Site2 L Vel (m/s) R Vel (m/s) L-R Vel (m/s)  Median  Acr Palm Anti Sensory (2nd Digit)  31.7C  Wrist 3.4 3.4 0.0 21.3 21.4 0.5 Wrist Palm     Palm 1.9 2.0 0.1 17.2 26.7 35.6       Radial Anti Sensory (Base 1st Digit)  31.6C  Wrist 2.3 2.2 0.1 22.8 23.8 4.2 Wrist Base 1st Digit     Ulnar Anti Sensory (5th Digit)  31.9C  Wrist *3.8 3.3 *0.5 19.0 15.9 16.3 Wrist 5th Digit *37 42 5   Motor Left/Right Comparison   Stim Site L Lat (ms) R Lat (ms) L-R Lat (ms) L Amp (mV) R Amp (mV) L-R Amp (%) Site1 Site2 L Vel (m/s) R Vel (m/s) L-R Vel (m/s)  Median Motor (Abd Poll Brev)  31.7C  Wrist 3.4 3.3 0.1 10.3 9.3 9.7 Elbow Wrist 55 55 0  Elbow 7.1 7.3 0.2 9.6 8.7 9.4       Ulnar Motor (Abd Dig Min)  31.8C  Wrist 2.9 2.7 0.2 7.6 8.2 7.3 B Elbow Wrist 61 54 7  B Elbow 6.2 6.5 0.3 7.8 8.1 3.7 A Elbow B Elbow 79 83 4  A Elbow 7.4 7.7 0.3 7.5 7.9 5.1          Waveforms:                     Clinical History: No specialty comments available.     Objective:  VS:  HT:    WT:   BMI:     BP:   HR: bpm  TEMP: ( )  RESP:  Physical Exam Musculoskeletal:        General: No swelling, tenderness or deformity.     Comments: Inspection reveals no atrophy of the bilateral APB or FDI or hand intrinsics. There is no swelling, color changes, allodynia or dystrophic changes. There is 5 out of 5 strength in the bilateral wrist extension, finger abduction and long finger flexion. There is intact sensation to light touch in all dermatomal and peripheral nerve distributions.  There is a negative Hoffmann's test bilaterally.  Skin:    General: Skin is warm and dry.     Findings: No erythema or rash.  Neurological:     General: No focal deficit present.     Mental Status: She is alert and oriented to person, place, and time.     Motor: No weakness or abnormal muscle tone.      Coordination: Coordination normal.  Psychiatric:        Mood and Affect: Mood normal.        Behavior: Behavior normal.     Ortho Exam Imaging: No results found.

## 2018-07-20 NOTE — Progress Notes (Signed)
  Numeric Pain Rating Scale and Functional Assessment Average Pain 7   In the last MONTH (on 0-10 scale) has pain interfered with the following?  1. General activity like being  able to carry out your everyday physical activities such as walking, climbing stairs, carrying groceries, or moving a chair?  Rating(9)    

## 2018-07-22 ENCOUNTER — Other Ambulatory Visit: Payer: Self-pay

## 2018-07-22 ENCOUNTER — Ambulatory Visit (INDEPENDENT_AMBULATORY_CARE_PROVIDER_SITE_OTHER): Payer: Managed Care, Other (non HMO) | Admitting: Orthopaedic Surgery

## 2018-07-22 DIAGNOSIS — M67432 Ganglion, left wrist: Secondary | ICD-10-CM | POA: Diagnosis not present

## 2018-07-22 DIAGNOSIS — G5601 Carpal tunnel syndrome, right upper limb: Secondary | ICD-10-CM

## 2018-07-22 DIAGNOSIS — G5602 Carpal tunnel syndrome, left upper limb: Secondary | ICD-10-CM | POA: Diagnosis not present

## 2018-07-22 NOTE — Progress Notes (Signed)
   Office Visit Note   Patient: Audrey Montgomery           Date of Birth: [birth year]           MRN: [id number] Visit Date: 07/22/2018              Requested by: [name], NP [place],  [place] [address] PCP: [name], NP   Assessment & Plan: Visit Diagnoses:  1. Left carpal tunnel syndrome   2. Right carpal tunnel syndrome   3. Ganglion cyst of volar aspect of left wrist     Plan: Impression is bilateral carpal tunnel syndrome with negative nerve conduction studies and recurrent left volar wrist ganglion cyst.  She is interested in receiving cortisone injections for carpal tunnel syndrome but she would like to have a driver present with her so she will make another appointment to do so.  For her cyst I think she is going to leave this alone for now since if not overly bothersome or symptomatic.  If it becomes more bothersome she may consider having this aspirated.  Otherwise we will see her back when she is ready for the carpal tunnel injections.  Follow-Up Instructions: Return if symptoms worsen or fail to improve.   Orders:  No orders of the defined types were placed in this encounter.  No orders of the defined types were placed in this encounter.     Procedures: No procedures performed   Clinical Data: No additional findings.   Subjective: Chief Complaint  Patient presents with  . Left Hand - Follow-up    Morghan comes in for nerve conduction study review and follow-up of the left volar wrist ganglion cyst.  The nerve conduction studies were normal.  She is status post right carpal tunnel release.  Her symptoms are unchanged.  Her ganglion cyst is relatively small today.   Review of Systems   Objective: Vital Signs: LMP  (LMP Unknown)   Physical Exam  Ortho Exam Bilateral hand and wrist exams are stable and unchanged. Specialty Comments:  No specialty comments available.  Imaging: No results found.   PMFS History:  Patient Active Problem List   Diagnosis Date Noted  . Viral upper respiratory tract infection 10/29/2015  . Elevated CK 10/03/2015  . Osteoarthritis of multiple joints 10/03/2015  . 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 Essential hypertension 06/18/2015  . Dizziness 07/16/2011  . Headache 07/16/2011  . Night sweats 07/16/2011  . Numbness 07/16/2011  . Weakness 07/16/2011   Past Medical History:  Diagnosis Date  . Cataract    Both Eyes.  . Hypertension     Family History  Problem Relation Age of Onset  . Stroke Brother     Past Surgical History:  Procedure Laterality Date  . APPENDECTOMY     Social History   Occupational History  . Not on file  Tobacco Use  . Smoking status: Former Smoker    Packs/day: 0.50    Types: Cigarettes    Quit date: 12/25/2017    Years since quitting: 0.5  . Smokeless tobacco: Never Used  . Tobacco comment: Pt. stated she stopped smoking for a month. 04/27/2018  Substance and Sexual Activity  . Alcohol use: Not Currently  . Drug use: No  . Sexual activity: Yes

## 2018-07-22 NOTE — Procedures (Signed)
EMG & NCV Findings: Evaluation of the left ulnar sensory nerve showed prolonged distal peak latency (3.8 ms) and decreased conduction velocity (Wrist-5th Digit, 37 m/s).  All remaining nerves (as indicated in the following tables) were within normal limits.  Left vs. Right side comparison data for the ulnar sensory nerve indicates abnormal L-R latency difference (0.5 ms).  All remaining left vs. right side differences were within normal limits.    All examined muscles (as indicated in the following table) showed no evidence of electrical instability.    Impression: Essentially NORMAL electrodiagnostic study of both upper limbs.    There is no significant electrodiagnostic evidence of nerve entrapment, brachial plexopathy, cervical radiculopathy or generalized peripheral neuropathy.    As you know, purely sensory or demyelinating radiculopathies and chemical radiculitis may not be detected with this particular electrodiagnostic study.  This electrodiagnostic study cannot rule out small fiber polyneuropathy and dysesthesias from central pain sensitization syndromes such as fibromyalgia.  Myotomal referral pain from trigger points is also not excluded.   Recommendations: 1.  Follow-up with referring physician. 2.  Continue current management of symptoms.  ___________________________ Laurence Spates FAAPMR Board Certified, American Board of Physical Medicine and Rehabilitation    Nerve Conduction Studies Anti Sensory Summary Table   Stim Site NR Peak (ms) Norm Peak (ms) P-T Amp (V) Norm P-T Amp Site1 Site2 Delta-P (ms) Dist (cm) Vel (m/s) Norm Vel (m/s)  Left Median Acr Palm Anti Sensory (2nd Digit)  31.7C  Wrist    3.4 <3.6 21.3 >10 Wrist Palm 1.5 0.0    Palm    1.9 <2.0 17.2         Right Median Acr Palm Anti Sensory (2nd Digit)  32.5C  Wrist    3.4 <3.6 21.4 >10 Wrist Palm 1.4 0.0    Palm    2.0 <2.0 26.7         Left Radial Anti Sensory (Base 1st Digit)  31.6C  Wrist    2.3 <3.1  22.8  Wrist Base 1st Digit 2.3 0.0    Right Radial Anti Sensory (Base 1st Digit)  32.1C  Wrist    2.2 <3.1 23.8  Wrist Base 1st Digit 2.2 0.0    Left Ulnar Anti Sensory (5th Digit)  31.9C  Wrist    *3.8 <3.7 19.0 >15.0 Wrist 5th Digit 3.8 14.0 *37 >38  Right Ulnar Anti Sensory (5th Digit)  32.6C  Wrist    3.3 <3.7 15.9 >15.0 Wrist 5th Digit 3.3 14.0 42 >38   Motor Summary Table   Stim Site NR Onset (ms) Norm Onset (ms) O-P Amp (mV) Norm O-P Amp Site1 Site2 Delta-0 (ms) Dist (cm) Vel (m/s) Norm Vel (m/s)  Left Median Motor (Abd Poll Brev)  31.7C  Wrist    3.4 <4.2 10.3 >5 Elbow Wrist 3.7 20.5 55 >50  Elbow    7.1  9.6         Right Median Motor (Abd Poll Brev)  32.1C  Wrist    3.3 <4.2 9.3 >5 Elbow Wrist 4.0 22.0 55 >50  Elbow    7.3  8.7         Left Ulnar Motor (Abd Dig Min)  31.8C  Wrist    2.9 <4.2 7.6 >3 B Elbow Wrist 3.3 20.0 61 >53  B Elbow    6.2  7.8  A Elbow B Elbow 1.2 9.5 79 >53  A Elbow    7.4  7.5         Right  Ulnar Motor (Abd Dig Min)  32.1C  Wrist    2.7 <4.2 8.2 >3 B Elbow Wrist 3.8 20.5 54 >53  B Elbow    6.5  8.1  A Elbow B Elbow 1.2 10.0 83 >53  A Elbow    7.7  7.9          EMG   Side Muscle Nerve Root Ins Act Fibs Psw Amp Dur Poly Recrt Int Dennie BiblePat Comment  Left Abd Poll Brev Median C8-T1 Nml Nml Nml Nml Nml 0 Nml Nml   Left 1stDorInt Ulnar C8-T1 Nml Nml Nml Nml Nml 0 Nml Nml   Left PronatorTeres Median C6-7 Nml Nml Nml Nml Nml 0 Nml Nml   Left Biceps Musculocut C5-6 Nml Nml Nml Nml Nml 0 Nml Nml   Left Deltoid Axillary C5-6 Nml Nml Nml Nml Nml 0 Nml Nml     Nerve Conduction Studies Anti Sensory Left/Right Comparison   Stim Site L Lat (ms) R Lat (ms) L-R Lat (ms) L Amp (V) R Amp (V) L-R Amp (%) Site1 Site2 L Vel (m/s) R Vel (m/s) L-R Vel (m/s)  Median Acr Palm Anti Sensory (2nd Digit)  31.7C  Wrist 3.4 3.4 0.0 21.3 21.4 0.5 Wrist Palm     Palm 1.9 2.0 0.1 17.2 26.7 35.6       Radial Anti Sensory (Base 1st Digit)  31.6C  Wrist 2.3 2.2 0.1 22.8  23.8 4.2 Wrist Base 1st Digit     Ulnar Anti Sensory (5th Digit)  31.9C  Wrist *3.8 3.3 *0.5 19.0 15.9 16.3 Wrist 5th Digit *37 42 5   Motor Left/Right Comparison   Stim Site L Lat (ms) R Lat (ms) L-R Lat (ms) L Amp (mV) R Amp (mV) L-R Amp (%) Site1 Site2 L Vel (m/s) R Vel (m/s) L-R Vel (m/s)  Median Motor (Abd Poll Brev)  31.7C  Wrist 3.4 3.3 0.1 10.3 9.3 9.7 Elbow Wrist 55 55 0  Elbow 7.1 7.3 0.2 9.6 8.7 9.4       Ulnar Motor (Abd Dig Min)  31.8C  Wrist 2.9 2.7 0.2 7.6 8.2 7.3 B Elbow Wrist 61 54 7  B Elbow 6.2 6.5 0.3 7.8 8.1 3.7 A Elbow B Elbow 79 83 4  A Elbow 7.4 7.7 0.3 7.5 7.9 5.1          Waveforms:

## 2018-08-09 ENCOUNTER — Other Ambulatory Visit: Payer: Self-pay

## 2018-08-09 ENCOUNTER — Encounter: Payer: Self-pay | Admitting: Orthopaedic Surgery

## 2018-08-09 ENCOUNTER — Ambulatory Visit (INDEPENDENT_AMBULATORY_CARE_PROVIDER_SITE_OTHER): Payer: Managed Care, Other (non HMO) | Admitting: Orthopaedic Surgery

## 2018-08-09 DIAGNOSIS — G5603 Carpal tunnel syndrome, bilateral upper limbs: Secondary | ICD-10-CM

## 2018-08-09 MED ORDER — BUPIVACAINE HCL 0.25 % IJ SOLN
0.3300 mL | INTRAMUSCULAR | Status: AC | PRN
  Administered 2018-08-09: .33 mL

## 2018-08-09 MED ORDER — METHYLPREDNISOLONE ACETATE 40 MG/ML IJ SUSP
40.0000 mg | INTRAMUSCULAR | Status: AC | PRN
  Administered 2018-08-09: 40 mg

## 2018-08-09 MED ORDER — METHYLPREDNISOLONE ACETATE 40 MG/ML IJ SUSP
40.0000 mg | INTRAMUSCULAR | Status: AC | PRN
  Administered 2018-08-09: 13:00:00 40 mg

## 2018-08-09 MED ORDER — LIDOCAINE HCL 1 % IJ SOLN
1.0000 mL | INTRAMUSCULAR | Status: AC | PRN
  Administered 2018-08-09: 1 mL

## 2018-08-09 MED ORDER — BUPIVACAINE HCL 0.25 % IJ SOLN
0.3300 mL | INTRAMUSCULAR | Status: AC | PRN
  Administered 2018-08-09: 13:00:00 .33 mL

## 2018-08-09 NOTE — Progress Notes (Signed)
Office Visit Note   Patient: Audrey Montgomery           Date of Birth: 28-Mar-1960           MRN: [id number] Visit Date: 08/09/2018              Requested by: [name], NP [place],  [place] [address] PCP: [name], NP   Assessment & Plan: Visit Diagnoses:  1. Bilateral carpal tunnel syndrome     Plan: Impression is bilateral carpal tunnel syndrome.  We injected both carpal tunnels with cortisone today.  She will follow-up with [place] as needed.  Call with concerns or questions anytime.  Follow-Up Instructions: Return if symptoms worsen or fail to improve.   Orders:  Orders Placed This Encounter  Procedures  . Hand/UE Inj: bilateral carpal tunnel   No orders of the defined types were placed in this encounter.     Procedures: Hand/UE Inj: bilateral carpal tunnel for carpal tunnel syndrome on 08/09/2018 1:08 PM Indications: pain Details: 25 G needle, volar approach Medications (Right): 0.33 mL bupivacaine 0.25 %; 1 mL lidocaine 1 %; 40 mg methylPREDNISolone acetate 40 MG/ML Medications (Left): 0.33 mL bupivacaine 0.25 %; 1 mL lidocaine 1 %; 40 mg methylPREDNISolone acetate 40 MG/ML      Clinical Data: No additional findings.   Subjective: Chief Complaint  Patient presents with  . Right Hand - Pain  . Left Hand - Pain    HPI patient is a pleasant 58 year old female who presents to our clinic today for follow-up of her bilateral carpal tunnel syndrome.  Recent nerve conduction studies were negative, but she is continued to have persistent symptoms of carpal tunnel syndrome.  No previous cortisone injections.  She has had continued pain which is worse at night as well as on the job where she works at [employer].  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  Review of Systems as detailed in HPI.  All others reviewed and are negative.   Objective: Vital Signs: LMP  (LMP Unknown)   Physical Exam  well-developed well-nourished female no acute distress.  Alert and oriented x3.  Ortho Exam stable exam of both wrists  Specialty Comments:  No specialty comments available.  Imaging: No new imaging   PMFS History: Patient Active Problem List   Diagnosis Date Noted  . Viral upper respiratory tract infection 10/29/2015  . Elevated CK 10/03/2015  . Osteoarthritis of multiple joints 10/03/2015  . 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 Essential hypertension 06/18/2015  . Dizziness 07/16/2011  . Headache 07/16/2011  . Night sweats 07/16/2011  . Numbness 07/16/2011  . Weakness 07/16/2011   Past Medical History:  Diagnosis Date  . Cataract    Both Eyes.  . Hypertension     Family History  Problem Relation Age of Onset  . Stroke Brother     Past Surgical History:  Procedure Laterality Date  . APPENDECTOMY     Social History   Occupational History  . Not on file  Tobacco Use  . Smoking status: Former Smoker    Packs/day: 0.50    Types: Cigarettes    Quit date: 12/25/2017    Years since quitting: 0.6  . Smokeless tobacco: Never Used  . Tobacco comment: Pt. stated she stopped smoking for a month. 04/27/2018  Substance and Sexual Activity  . Alcohol use: Not Currently  . Drug use: No  . Sexual activity: Yes

## 2018-08-11 ENCOUNTER — Encounter: Payer: Self-pay | Admitting: Orthopaedic Surgery

## 2018-09-15 ENCOUNTER — Encounter: Payer: Self-pay | Admitting: Orthopaedic Surgery

## 2018-09-15 ENCOUNTER — Telehealth: Payer: Self-pay | Admitting: Orthopaedic Surgery

## 2018-09-15 ENCOUNTER — Encounter: Payer: Self-pay | Admitting: Nurse Practitioner

## 2018-09-15 NOTE — Telephone Encounter (Signed)
Yes  thx

## 2018-09-15 NOTE — Telephone Encounter (Signed)
Called patient to schedule an appointment with Dr Erlinda Hong for bil hand pain. Got recording mailbox is full   So i left a  SMS message

## 2018-09-16 ENCOUNTER — Other Ambulatory Visit: Payer: Self-pay | Admitting: Nurse Practitioner

## 2018-09-16 DIAGNOSIS — I10 Essential (primary) hypertension: Secondary | ICD-10-CM

## 2018-09-16 MED ORDER — LISINOPRIL 20 MG PO TABS: 20.0000 mg | ORAL_TABLET | Freq: Every day | ORAL | 3 refills | Status: DC

## 2018-09-16 MED ORDER — AMLODIPINE BESYLATE 5 MG PO TABS: 5.0000 mg | ORAL_TABLET | Freq: Every day | ORAL | 0 refills | Status: DC

## 2018-09-16 MED ORDER — AMLODIPINE BESYLATE 5 MG PO TABS: 5.0000 mg | ORAL_TABLET | Freq: Every day | ORAL | 1 refills | Status: DC

## 2018-09-16 MED ORDER — LISINOPRIL 20 MG PO TABS: 20.0000 mg | ORAL_TABLET | Freq: Every day | ORAL | 0 refills | Status: DC

## 2018-09-21 ENCOUNTER — Other Ambulatory Visit: Payer: Self-pay

## 2018-09-21 ENCOUNTER — Ambulatory Visit (INDEPENDENT_AMBULATORY_CARE_PROVIDER_SITE_OTHER): Payer: Managed Care, Other (non HMO) | Admitting: Orthopaedic Surgery

## 2018-09-21 ENCOUNTER — Encounter: Payer: Self-pay | Admitting: Orthopaedic Surgery

## 2018-09-21 DIAGNOSIS — M79642 Pain in left hand: Secondary | ICD-10-CM | POA: Diagnosis not present

## 2018-09-21 DIAGNOSIS — M79641 Pain in right hand: Secondary | ICD-10-CM

## 2018-09-21 NOTE — Progress Notes (Signed)
   Office Visit Note   Patient: Audrey Montgomery           Date of Birth: 01/06/1961           MRN: [id number] Visit Date: 09/21/2018              Requested by: Audrey Pounds, NP [place],   [id number] PCP: Audrey Pounds, NP   Assessment & Plan: Visit Diagnoses:  1. Pain in both hands     Plan: Impression is bilateral hand pain with negative nerve conduction studies for carpal tunnel syndrome and no relief from carpal tunnel cortisone injections.  Braces have also not helped.  At this point we will refer to neurology for further evaluation.  Follow-up as needed.  Follow-Up Instructions: Return if symptoms worsen or fail to improve.   Orders:  Orders Placed This Encounter  Procedures  . Ambulatory referral to Neurology   No orders of the defined types were placed in this encounter.     Procedures: No procedures performed   Clinical Data: No additional findings.   Subjective: Chief Complaint  Patient presents with  . Right Hand - Pain  . Left Hand - Pain    Audrey Montgomery returns today for bilateral hand pain.  She received carpal tunnel injections last month.  She states that injections did not help.   Review of Systems   Objective: Vital Signs: LMP  (LMP Unknown)   Physical Exam  Ortho Exam Bilateral hand exams are unchanged. Specialty Comments:  No specialty comments available.  Imaging: No results found.   PMFS History: Patient Active Problem List   Diagnosis Date Noted  . Viral upper respiratory tract infection 10/29/2015  . Elevated CK 10/03/2015  . Osteoarthritis of multiple joints 10/03/2015  . 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 Essential hypertension 06/18/2015  . Dizziness 07/16/2011  . Headache 07/16/2011  . Night sweats 07/16/2011  . Numbness 07/16/2011  . Weakness 07/16/2011   Past Medical History:  Diagnosis Date  . Cataract    Both Eyes.  . Hypertension     Family History  Problem Relation  Age of Onset  . Stroke Brother     Past Surgical History:  Procedure Laterality Date  . APPENDECTOMY     Social History   Occupational History  . Not on file  Tobacco Use  . Smoking status: Former Smoker    Packs/day: 0.50    Types: Cigarettes    Quit date: 12/25/2017    Years since quitting: 0.7  . Smokeless tobacco: Never Used  . Tobacco comment: Pt. stated she stopped smoking for a month. 04/27/2018  Substance and Sexual Activity  . Alcohol use: Not Currently  . Drug use: No  . Sexual activity: Yes

## 2018-09-30 ENCOUNTER — Encounter: Payer: Self-pay | Admitting: Orthopaedic Surgery

## 2018-10-10 ENCOUNTER — Encounter: Payer: Self-pay | Admitting: Neurology

## 2018-10-10 ENCOUNTER — Ambulatory Visit (INDEPENDENT_AMBULATORY_CARE_PROVIDER_SITE_OTHER): Payer: Managed Care, Other (non HMO) | Admitting: Neurology

## 2018-10-10 ENCOUNTER — Other Ambulatory Visit: Payer: Self-pay

## 2018-10-10 VITALS — BP 112/78 | HR 84 | Temp 97.6°F | Ht 68.0 in | Wt 198.0 lb

## 2018-10-10 DIAGNOSIS — R5382 Chronic fatigue, unspecified: Secondary | ICD-10-CM | POA: Diagnosis not present

## 2018-10-10 DIAGNOSIS — Z131 Encounter for screening for diabetes mellitus: Secondary | ICD-10-CM

## 2018-10-10 DIAGNOSIS — R200 Anesthesia of skin: Secondary | ICD-10-CM | POA: Diagnosis not present

## 2018-10-10 DIAGNOSIS — M5412 Radiculopathy, cervical region: Secondary | ICD-10-CM

## 2018-10-10 DIAGNOSIS — M6281 Muscle weakness (generalized): Secondary | ICD-10-CM

## 2018-10-10 DIAGNOSIS — R252 Cramp and spasm: Secondary | ICD-10-CM

## 2018-10-10 DIAGNOSIS — R202 Paresthesia of skin: Secondary | ICD-10-CM

## 2018-10-10 MED ORDER — GABAPENTIN 300 MG PO CAPS: 300.0000 mg | ORAL_CAPSULE | Freq: Three times a day (TID) | ORAL | 11 refills | Status: DC

## 2018-10-10 NOTE — Progress Notes (Addendum)
[facility]    Provider:  Dr Jaynee Eagles, Requesting Provider: [name], NP,  Dr. Frankey Shown Primary Care Provider:  [name], NP  CC:  Hand cramping  HPI:  Audrey Montgomery is a 58 y.o. female here as requested by [name], NP,  Dr. Erlinda Hong, [place] for hand pain. 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 Symptoms started about 9 months ago, she was told to wear braces and she still wears them, she went to the orthopaedic doctor and nerve test was unremarkable, cortisone shots into both hands did not work. Her hand lock and cramp and get numb, the whole hand all fingers. She works at [employer] and she drives a Optician, dispensing all day and uses her hands a lot on the [employer], turns with one hand and throttles with the other. Drops things. She was brushing her hair and the other day and she could not release and she gets locked. Takes 5 minutes until she rubs it out. Only the hands with use. Sometimes it radiates up her arm and her arms ache. Both hands equally and symmetric. Sometimes her feet go numb as well only when sitting too long and has to get up and walk around and resolves not associated with the hands. Progressive. Using the hands makes it worse, rubbing them helps but it takes a while. Bilateral, symmetric, they can hurt so much she cries. Happens daily. No Hx of nerve disorders or muscle disorders in the family. No other focal neurologic deficits, associated symptoms, inciting events or modifiable factors.  Reviewed notes, labs and imaging from outside physicians, which showed:  Reviewed EMG/NCS data ulnar sensory nerve slightly delayed latency however otherwise unremarkable. Performed both limbs. Median, radial, ulnar motor and sensory studies. muscles left normal.   Magnesium, b12 and folate, CMP slightly elevated creatinine,    Review of Systems: Patient complains of symptoms per HPI as well as  the following symptoms: no other weakness, she feels shortness of breath on exertion, she has joint pain, no memory loss, she has cataracts. Pertinent negatives and positives per HPI. All others negative.   Social History   Socioeconomic History   Marital status: Widowed    Spouse name: Not on file   Number of children: Not on file   Years of education: Not on file   Highest education level: Not on file  Occupational History   Not on file  Social Needs   Financial resource strain: Not on file   Food insecurity    Worry: Not on file    Inability: Not on file   Transportation needs    Medical: Not on file    Non-medical: Not on file  Tobacco Use   Smoking status: Current Some Day Smoker    Packs/day: 0.50    Types: Cigarettes    Last attempt to quit: 12/25/2017    Years since quitting: 0.7   Smokeless tobacco: Never Used   Tobacco comment: Pt. stated she stopped smoking for a month.  04/27/2018  Substance and Sexual Activity   Alcohol use: Not Currently   Drug use: No   Sexual activity: Yes  Lifestyle   Physical activity    Days per week: Not on file    Minutes per session: Not on file   Stress: Not on file  Relationships   Social connections    Talks on phone: Not on file    Gets  together: Not on file    Attends religious service: Not on file    Active member of club or organization: Not on file    Attends meetings of clubs or organizations: Not on file    Relationship status: Not on file   Intimate partner violence    Fear of current or ex partner: Not on file    Emotionally abused: Not on file    Physically abused: Not on file    Forced sexual activity: Not on file  Other Topics Concern   Not on file  Social History Narrative   Lives at home with her brother. He had a stroke and she cares for him.   Right handed    Family History  Problem Relation Age of Onset   Stroke Brother    Peripheral vascular disease Other        father's side    Neuropathy Neg Hx        that she knows of     Past Medical History:  Diagnosis Date   Cataract    Both Eyes.   Hypertension     Patient Active Problem List   Diagnosis Date Noted   Viral upper respiratory tract infection 10/29/2015   Elevated CK 10/03/2015   Osteoarthritis of multiple joints 10/03/2015   Tendinopathy of shoulder 10/03/2015   Essential hypertension 06/18/2015   Dizziness 07/16/2011   Headache 07/16/2011   Night sweats 07/16/2011   Numbness 07/16/2011   Weakness 07/16/2011    Past Surgical History:  Procedure Laterality Date   APPENDECTOMY      Current Outpatient Medications  Medication Sig Dispense Refill   albuterol (PROVENTIL HFA;VENTOLIN HFA) 108 (90 Base) MCG/ACT inhaler Inhale 2 puffs into the lungs every 6 (six) hours as needed for wheezing or shortness of breath. 1 Inhaler 2   amLODipine (NORVASC) 5 MG tablet Take 1 tablet (5 mg total) by mouth daily. 90 tablet 0   benzonatate (TESSALON) 100 MG capsule Take 1 capsule (100 mg total) by mouth 3 (three) times daily as needed for cough. 30 capsule 0   diclofenac sodium (VOLTAREN) 1 % GEL Apply 2 g topically 4 (four) times daily. Rub into affected area of foot 2 to 4 times daily 100 g 2   lisinopril (ZESTRIL) 20 MG tablet Take 1 tablet (20 mg total) by mouth daily. 90 tablet 0   loperamide (IMODIUM A-D) 2 MG tablet Take 1 tablet (2 mg total) by mouth 4 (four) times daily as needed for diarrhea or loose stools. 30 tablet 0   varenicline (CHANTIX CONTINUING MONTH PAK) 1 MG tablet Take 1 tablet (1 mg total) by mouth 2 (two) times daily. 56 tablet 2   gabapentin (NEURONTIN) 300 MG capsule Take 1 capsule (300 mg total) by mouth 3 (three) times daily. 90 capsule 11   No current facility-administered medications for this visit.     Allergies as of 10/10/2018   (No Known Allergies)    Vitals: BP 112/78 (BP Location: Right Arm, Patient Position: Sitting)    Pulse 84    Temp 97.6 F  (36.4 C) Comment: taken by check-in staff   Ht _0  (1.727 m)    Wt 198 lb (89.8 kg)    LMP  (LMP Unknown)    BMI 30.11 kg/m  Last Weight:  Wt Readings from Last 1 Encounters:  10/10/18 198 lb (89.8 kg)   Last Height:   Ht Readings from Last 1 Encounters:  10/10/18 _1  (1.727 m)  Physical exam: Exam: Gen: NAD, conversant, well nourised, obese, well groomed                     CV: RRR, no MRG. No Carotid Bruits. No peripheral edema, warm, nontender Eyes: Conjunctivae clear without exudates or hemorrhage Wasting of temples slightly.   Neuro: Detailed Neurologic Exam  Speech:    Speech is normal; fluent and spontaneous with normal comprehension.  Cognition:    The patient is oriented to person, place, and time;     recent and remote memory intact;     language fluent;     normal attention, concentration,     fund of knowledge Cranial Nerves:    The pupils are equal, round, and reactive to light. Cannot visualze fundi due to cataracts. . Visual fields are full to finger confrontation. Extraocular movements are intact. Trigeminal sensation is intact and the muscles of mastication are normal. The face is symmetric. The palate elevates in the midline. Hearing intact. Voice is normal. Shoulder shrug is normal. The tongue has normal motion without fasciculations.   Coordination:    Normal finger to nose and heel to shin. Normal rapid alternating movements.   Gait:    Heel-toe and tandem gait are normal.   Motor Observation:    No asymmetry, no atrophy, and no involuntary movements noted. Tone:    Normal muscle tone.    Posture:    Posture is normal. normal erect    Strength: mild prox weakness otherwise strength is V/V in the upper and lower limbs.      Sensation: intact to LT     Reflex Exam:  DTR's:    Deep tendon reflexes in the upper and lower extremities are normal bilaterally.   Toes:    The toes are downgoing bilaterally.   Clonus:    Clonus is  absent.  No percussion myotonia    Assessment/Plan:  58 y.o. female here as requested by [name], NP for hand pain. 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 EMG/NCS unremarkable. Cramping in hands, numbness, tingling.  - MRI cervical spine for cervical radic - Labwork today - Myotonic dystrophy? She has cataracts. Will check hgba1c. ,may consider genetic testing. - Occupational Therapy - consider repeat emg/ncs left with more muscles to look for myotonia  - consider: Specific genetic testing to demonstrate the presence of an expanded cytosine-thymine-guanine (CTG) repeat in the dystrophia myotonica protein kinase (DMPK) gene is the gold standard for the diagnosis of DM1. Normal DMPK gene alleles contain 5 to 34 CTG repeats. Mutable normal alleles (premutation alleles) contain 35 to 49 repeats. Children of individuals with the premutation are at increased risk of inheriting a larger repeat size and having symptoms. Full penetrance alleles of ?50 CTG repeats are associated with disease manifestations - mexiletine may be helpful will try Gabapentin first.  - f/u at emg/ncs - sleep study?    Orders Placed This Encounter  Procedures   MR CERVICAL SPINE WO CONTRAST   TSH   T4, Free   Hemoglobin A1c   CK   CBC   Comprehensive metabolic panel   Vitamin B6   Multiple Myeloma Panel (SPEP&IFE w/QIG)   Vitamin B1   Ambulatory referral to Occupational Therapy   NCV with EMG(electromyography)   Meds ordered this encounter  Medications   gabapentin (NEURONTIN) 300 MG capsule    Sig: Take 1 capsule (300 mg total) by mouth  3 (three) times daily.    Dispense:  90 capsule    Refill:  11    Cc: [name], NP,  Dr. Erlinda Hong, [place]  [place], [facility] [address] [place], [place] [address]  Phone [phone] Fax [phone]

## 2018-10-10 NOTE — Patient Instructions (Addendum)
EMG/NCS left hand MRI cervical spine Labwork Occupational therapy Consider genetic testing for a muscle disorder (myotonic dystrophy) Sleep study?   Gabapentin capsules or tablets What is this medicine? GABAPENTIN (GA ba pen tin) is used to control seizures in certain types of epilepsy. It is also used to treat certain types of nerve pain. This medicine may be used for other purposes; ask your health care provider or pharmacist if you have questions. COMMON BRAND NAME(S): Active-PAC with Gabapentin, Gabarone, Neurontin What should I tell my health care provider before I take this medicine? They need to know if you have any of these conditions:  history of drug abuse or alcohol abuse problem  kidney disease  lung or breathing disease  suicidal thoughts, plans, or attempt; a previous suicide attempt by you or a family member  an unusual or allergic reaction to gabapentin, other medicines, foods, dyes, or preservatives  pregnant or trying to get pregnant  breast-feeding How should I use this medicine? Take this medicine by mouth with a glass of water. Follow the directions on the prescription label. You can take it with or without food. If it upsets your stomach, take it with food. Take your medicine at regular intervals. Do not take it more often than directed. Do not stop taking except on your doctor's advice. If you are directed to break the 600 or 800 mg tablets in half as part of your dose, the extra half tablet should be used for the next dose. If you have not used the extra half tablet within 28 days, it should be thrown away. A special MedGuide will be given to you by the pharmacist with each prescription and refill. Be sure to read this information carefully each time. Talk to your pediatrician regarding the use of this medicine in children. While this drug may be prescribed for children as young as 3 years for selected conditions, precautions do apply. Overdosage: If you think  you have taken too much of this medicine contact a poison control center or emergency room at once. NOTE: This medicine is only for you. Do not share this medicine with others. What if I miss a dose? If you miss a dose, take it as soon as you can. If it is almost time for your next dose, take only that dose. Do not take double or extra doses. What may interact with this medicine? This medicine may interact with the following medications:  alcohol  antihistamines for allergy, cough, and cold  certain medicines for anxiety or sleep  certain medicines for depression like amitriptyline, fluoxetine, sertraline  certain medicines for seizures like phenobarbital, primidone  certain medicines for stomach problems  general anesthetics like halothane, isoflurane, methoxyflurane, propofol  local anesthetics like lidocaine, pramoxine, tetracaine  medicines that relax muscles for surgery  narcotic medicines for pain  phenothiazines like chlorpromazine, mesoridazine, prochlorperazine, thioridazine This list may not describe all possible interactions. Give your health care provider a list of all the medicines, herbs, non-prescription drugs, or dietary supplements you use. Also tell them if you smoke, drink alcohol, or use illegal drugs. Some items may interact with your medicine. What should I watch for while using this medicine? Visit your doctor or health care provider for regular checks on your progress. You may want to keep a record at home of how you feel your condition is responding to treatment. You may want to share this information with your doctor or health care provider at each visit. You should contact your doctor  or health care provider if your seizures get worse or if you have any new types of seizures. Do not stop taking this medicine or any of your seizure medicines unless instructed by your doctor or health care provider. Stopping your medicine suddenly can increase your seizures or  their severity. This medicine may cause serious skin reactions. They can happen weeks to months after starting the medicine. Contact your health care provider right away if you notice fevers or flu-like symptoms with a rash. The rash may be red or purple and then turn into blisters or peeling of the skin. Or, you might notice a red rash with swelling of the face, lips or lymph nodes in your neck or under your arms. Wear a medical identification bracelet or chain if you are taking this medicine for seizures, and carry a card that lists all your medications. You may get drowsy, dizzy, or have blurred vision. Do not drive, use machinery, or do anything that needs mental alertness until you know how this medicine affects you. To reduce dizzy or fainting spells, do not sit or stand up quickly, especially if you are an older patient. Alcohol can increase drowsiness and dizziness. Avoid alcoholic drinks. Your mouth may get dry. Chewing sugarless gum or sucking hard candy, and drinking plenty of water will help. The use of this medicine may increase the chance of suicidal thoughts or actions. Pay special attention to how you are responding while on this medicine. Any worsening of mood, or thoughts of suicide or dying should be reported to your health care provider right away. Women who become pregnant while using this medicine may enroll in the [place] Pregnancy Registry by calling [phone]. This registry collects information about the safety of antiepileptic drug use during pregnancy. What side effects may I notice from receiving this medicine? Side effects that you should report to your doctor or health care professional as soon as possible:  allergic reactions like skin rash, itching or hives, swelling of the face, lips, or tongue  breathing problems  rash, fever, and swollen lymph nodes  redness, blistering, peeling or loosening of the skin, including inside the  mouth  suicidal thoughts, mood changes Side effects that usually do not require medical attention (report to your doctor or health care professional if they continue or are bothersome):  dizziness  drowsiness  headache  nausea, vomiting  swelling of ankles, feet, hands  tiredness This list may not describe all possible side effects. Call your doctor for medical advice about side effects. You may report side effects to FDA at 1-800-FDA-1088. Where should I keep my medicine? Keep out of reach of children. This medicine may cause accidental overdose and death if it taken by other adults, children, or pets. Mix any unused medicine with a substance like cat litter or coffee grounds. Then throw the medicine away in a sealed container like a sealed bag or a coffee can with a lid. Do not use the medicine after the expiration date. Store at room temperature between 15 and 30 degrees C (59 and 86 degrees F). NOTE: This sheet is a summary. It may not cover all possible information. If you have questions about this medicine, talk to your doctor, pharmacist, or health care provider.  2020 Elsevier/Gold Standard (2018-04-29 14:16:43)

## 2018-10-12 ENCOUNTER — Ambulatory Visit
Admission: RE | Admit: 2018-10-12 | Discharge: 2018-10-12 | Disposition: A | Discharge status: Home / Self Care | Payer: Managed Care, Other (non HMO) | Source: Ambulatory Visit | Attending: Neurology | Admitting: Neurology

## 2018-10-12 ENCOUNTER — Other Ambulatory Visit: Payer: Self-pay

## 2018-10-12 DIAGNOSIS — R200 Anesthesia of skin: Secondary | ICD-10-CM | POA: Diagnosis not present

## 2018-10-12 DIAGNOSIS — R5382 Chronic fatigue, unspecified: Secondary | ICD-10-CM

## 2018-10-12 DIAGNOSIS — R202 Paresthesia of skin: Secondary | ICD-10-CM

## 2018-10-12 DIAGNOSIS — M6281 Muscle weakness (generalized): Secondary | ICD-10-CM

## 2018-10-12 DIAGNOSIS — R252 Cramp and spasm: Secondary | ICD-10-CM

## 2018-10-12 DIAGNOSIS — M5412 Radiculopathy, cervical region: Secondary | ICD-10-CM

## 2018-10-15 LAB — MULTIPLE MYELOMA PANEL, SERUM
Albumin SerPl Elph-Mcnc: 4 g/dL (ref 2.9–4.4)
Albumin/Glob SerPl: 1.4 (ref 0.7–1.7)
Alpha 1: 0.2 g/dL (ref 0.0–0.4)
Alpha2 Glob SerPl Elph-Mcnc: 0.8 g/dL (ref 0.4–1.0)
B-Globulin SerPl Elph-Mcnc: 1.1 g/dL (ref 0.7–1.3)
Gamma Glob SerPl Elph-Mcnc: 0.9 g/dL (ref 0.4–1.8)
Globulin, Total: 3 g/dL (ref 2.2–3.9)
IgA/Immunoglobulin A, Serum: 255 mg/dL (ref 87–352)
IgG (Immunoglobin G), Serum: 996 mg/dL (ref 586–1602)
IgM (Immunoglobulin M), Srm: 57 mg/dL (ref 26–217)

## 2018-10-15 LAB — COMPREHENSIVE METABOLIC PANEL
ALT: 9 IU/L (ref 0–32)
AST: 14 IU/L (ref 0–40)
Albumin/Globulin Ratio: 2 (ref 1.2–2.2)
Albumin: 4.7 g/dL (ref 3.8–4.9)
Alkaline Phosphatase: 85 IU/L (ref 39–117)
BUN/Creatinine Ratio: 10 (ref 9–23)
BUN: 12 mg/dL (ref 6–24)
Bilirubin Total: 0.5 mg/dL (ref 0.0–1.2)
CO2: 22 mmol/L (ref 20–29)
Calcium: 9.6 mg/dL (ref 8.7–10.2)
Chloride: 106 mmol/L (ref 96–106)
Creatinine, Ser: 1.25 mg/dL — ABNORMAL HIGH (ref 0.57–1.00)
GFR calc Af Amer: 55 mL/min/{1.73_m2} — ABNORMAL LOW (ref >59)
GFR calc non Af Amer: 48 mL/min/{1.73_m2} — ABNORMAL LOW (ref >59)
Globulin, Total: 2.3 g/dL (ref 1.5–4.5)
Glucose: 91 mg/dL (ref 65–99)
Potassium: 4.3 mmol/L (ref 3.5–5.2)
Sodium: 141 mmol/L (ref 134–144)
Total Protein: 7 g/dL (ref 6.0–8.5)

## 2018-10-15 LAB — HEMOGLOBIN A1C
Est. average glucose Bld gHb Est-mCnc: 111 mg/dL
Hgb A1c MFr Bld: 5.5 % (ref 4.8–5.6)

## 2018-10-15 LAB — T4, FREE: Free T4: 1.37 ng/dL (ref 0.82–1.77)

## 2018-10-15 LAB — CBC
Hematocrit: 44.2 % (ref 34.0–46.6)
Hemoglobin: 15.1 g/dL (ref 11.1–15.9)
MCH: 33 pg (ref 26.6–33.0)
MCHC: 34.2 g/dL (ref 31.5–35.7)
MCV: 97 fL (ref 79–97)
Platelets: 253 10*3/uL (ref 150–450)
RBC: 4.58 x10E6/uL (ref 3.77–5.28)
RDW: 12.8 % (ref 11.7–15.4)
WBC: 8.7 10*3/uL (ref 3.4–10.8)

## 2018-10-15 LAB — CK: Total CK: 97 U/L (ref 32–182)

## 2018-10-15 LAB — VITAMIN B1: Thiamine: 133.7 nmol/L (ref 66.5–200.0)

## 2018-10-15 LAB — VITAMIN B6: Vitamin B6: 12.8 ug/L (ref 2.0–32.8)

## 2018-10-15 LAB — TSH: TSH: 1.71 u[IU]/mL (ref 0.450–4.500)

## 2018-11-10 ENCOUNTER — Ambulatory Visit (INDEPENDENT_AMBULATORY_CARE_PROVIDER_SITE_OTHER): Payer: Self-pay | Admitting: Neurology

## 2018-11-10 ENCOUNTER — Other Ambulatory Visit: Payer: Self-pay

## 2018-11-10 DIAGNOSIS — Z131 Encounter for screening for diabetes mellitus: Secondary | ICD-10-CM

## 2018-11-10 DIAGNOSIS — R5382 Chronic fatigue, unspecified: Secondary | ICD-10-CM

## 2018-11-10 DIAGNOSIS — R200 Anesthesia of skin: Secondary | ICD-10-CM

## 2018-11-10 DIAGNOSIS — R252 Cramp and spasm: Secondary | ICD-10-CM

## 2018-11-10 DIAGNOSIS — M79643 Pain in unspecified hand: Secondary | ICD-10-CM

## 2018-11-10 DIAGNOSIS — M5412 Radiculopathy, cervical region: Secondary | ICD-10-CM

## 2018-11-10 DIAGNOSIS — Z0289 Encounter for other administrative examinations: Secondary | ICD-10-CM

## 2018-11-10 DIAGNOSIS — R202 Paresthesia of skin: Secondary | ICD-10-CM

## 2018-11-10 DIAGNOSIS — M6281 Muscle weakness (generalized): Secondary | ICD-10-CM

## 2018-11-10 MED ORDER — PREGABALIN 25 MG PO CAPS: 25.0000 mg | ORAL_CAPSULE | Freq: Two times a day (BID) | ORAL | 6 refills | Status: DC

## 2018-11-10 NOTE — Progress Notes (Signed)
History: This is a 58 year old female who was sent here for hand pain.  She does have a past medical history of carpal tunnel in the right hand status post release surgery in the 90s.  She is reporting cramping in the hands numbness and tingling.  However her significant symptoms are more related to her muscles, cramping in the hands, she uses her hands a lot, hands locking up, takes 5 minutes to rub out due to cramping, her arms ache due to cramping.  EMG nerve conduction study in June was negative however repeating today since symptoms are progressive.  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 Reviewed workup with patient to date:  Vitamin B1, B6, multiple myeloma panel, CK, hemoglobin A1c, TSH, thiamine, vitamin B6, free T4, CBC, IFE were all normal.  CMP showed elevated creatinine 1.25 otherwise normal.  I reviewed all these findings with patient today.  No history of neuromuscular disease in patient or family. No myotonia on emg/needle study.  - she had an episode of cramping pain in the hand today, she appeared to be in pain but she could make a fist and release, no abnormal muscle movements were noted. We applied a heat pack which helped.   - stop gabapentin and start Lyrica  - Will check Mag and Ionized Calcium today  Orders Placed This Encounter  Procedures  . Magnesium  . Calcium, ionized   Meds ordered this encounter  Medications  . pregabalin (LYRICA) 25 MG capsule    Sig: Take 1 capsule (25 mg total) by mouth 2 (two) times daily. In one week may increase to 65m twice daily. Stop Gabapentin.    Dispense:  120 capsule    Refill:  6   A total of 15 minutes was spent face-to-face with this patient. Over half this time was spent on counseling patient on the  1. Cramping of hands   2. Numbness and tingling in both  hands   3. Hand muscle weakness   4. Cervical radiculopathy   5. Chronic fatigue   6. Screening for diabetes mellitus    diagnosis and different diagnostic and therapeutic options, counseling and coordination of care, risks ans benefits of management, compliance, or risk factor reduction and education.  This does not include time spent on emg/ncs.

## 2018-11-10 NOTE — Progress Notes (Signed)
Full Name: Audrey Montgomery Gender: Female MRN #: [id number] Date of Birth: May 21, 1960    Visit Date: 11/10/2018 07:40 Age: 58 Years 0 Months Old Examining Physician: Naomie Dean, MD  Referring Physician: Bertram Denver, NP  History: This is a 58 year old female who was sent here for hand pain.  She does have a past medical history of carpal tunnel in the right hand status post release surgery in the 90s.  She is reporting cramping in the hands numbness and tingling.  However her significant symptoms are more related to her muscles, cramping in the hands and legs.  Summary: EMG/NCS was performed on the bilateral upper extremities. All nerves and muscles (as indicated in the following tables) were within normal limits.    Conclusion: This is a normal examination of the upper extremities. No evidence for mononeuropathy, polyneuropathy, cervical radiculopathy or muscle disorder.   Naomie Dean, M.D.  [facility] [address] [place], [place] [address] Tel: [phone] Fax: [phone]        [facility]    Nerve / Sites Muscle Latency Ref. Amplitude Ref. Rel Amp Segments Distance Velocity Ref. Area    ms ms mV mV %  cm m/s m/s mVms  L Median - APB     Wrist APB 3.8 ?4.4 11.0 ?4.0 100 Wrist - APB 7   44.8     Upper arm APB 7.8  10.3  93 Upper arm - Wrist 22 55 ?49 41.7  R Median - APB     Wrist APB 3.6 ?4.4 10.8 ?4.0 100 Wrist - APB 7   40.3     Upper arm APB 7.8  10.2  94.9 Upper arm - Wrist 22 52 ?49 40.0  L Ulnar - ADM     Wrist ADM 2.9 ?3.3 9.6 ?6.0 100 Wrist - ADM 7   37.2     B.Elbow ADM 6.2  9.3  96.6 B.Elbow - Wrist 19 58 ?49 37.7     A.Elbow ADM 8.0  8.8  94.8 A.Elbow - B.Elbow 10 56 ?49 37.4         A.Elbow - Wrist      R Ulnar - ADM     Wrist ADM 2.8 ?3.3 9.0 ?6.0 100 Wrist - ADM 7   38.3     B.Elbow ADM 6.4  8.6  96 B.Elbow - Wrist 19 53 ?49 39.5     A.Elbow ADM 8.2  8.6  100 A.Elbow - B.Elbow 10 53 ?49 39.8         A.Elbow - Wrist        SNC    Nerve / Sites Rec. Site Peak Lat Ref.  Amp Ref. Segments Distance Peak Diff Ref.    ms ms V V  cm ms ms  L Radial - Anatomical snuff box (Forearm)     Forearm Wrist 2.8 ?2.9 30 ?15 Forearm - Wrist 10    R Radial - Anatomical snuff box (Forearm)     Forearm Wrist 2.4 ?2.9 25 ?15 Forearm - Wrist 10    L Median, Ulnar - Transcarpal comparison     Median Palm Wrist 2.2 ?2.2 55 ?35 Median Palm - Wrist 8       Ulnar Palm Wrist 2.3 ?2.2 15 ?12 Ulnar Palm - Wrist 8          Median Palm - Ulnar Palm  -0.1 ?0.4  R Median, Ulnar - Transcarpal comparison     Median Palm Wrist 2.2 ?  2.2 42 ?35 Median Palm - Wrist 8       Ulnar Palm Wrist 2.2 ?2.2 17 ?12 Ulnar Palm - Wrist 8          Median Palm - Ulnar Palm  -0.1 ?0.4  L Median - Orthodromic (Dig II, Mid palm)     Dig II Wrist 3.1 ?3.4 13 ?10 Dig II - Wrist 13    R Median - Orthodromic (Dig II, Mid palm)     Dig II Wrist 3.1 ?3.4 16 ?10 Dig II - Wrist 13    L Ulnar - Orthodromic, (Dig V, Mid palm)     Dig V Wrist 3.1 ?3.1 7 ?5 Dig V - Wrist 11    R Ulnar - Orthodromic, (Dig V, Mid palm)     Dig V Wrist 3.1 ?3.1 8 ?5 Dig V - Wrist 46                       F  Wave    Nerve F Lat Ref.   ms ms  L Ulnar - ADM 28.0 ?32.0  R Ulnar - ADM 30.1 ?32.0         EMG full       EMG Summary Table    Spontaneous MUAP Recruitment  Muscle IA Fib PSW Fasc Other Amp Dur. Poly Pattern  R. Deltoid Normal None None None _______ Normal Normal Normal Normal  R. Biceps brachii Normal None None None _______ Normal Normal Normal Normal  R. Triceps brachii Normal None None None _______ Normal Normal Normal Normal  R. Pronator teres Normal None None None _______ Normal Normal Normal Normal  R. Extensor indicis proprius Normal None None None _______ Normal Normal Normal Normal  R. First dorsal interosseous Normal None None None _______ Normal Normal Normal Normal  R. Opponens pollicis Normal None None None _______ Normal Normal Normal Normal

## 2018-11-10 NOTE — Patient Instructions (Signed)
Stop Gabapentin  Pregabalin capsules What is this medicine? PREGABALIN (pre GAB a lin) is used to treat nerve pain from diabetes, shingles, spinal cord injury, and fibromyalgia. It is also used to control seizures in epilepsy. This medicine may be used for other purposes; ask your health care provider or pharmacist if you have questions. COMMON BRAND NAME(S): Lyrica What should I tell my health care provider before I take this medicine? They need to know if you have any of these conditions:  heart disease  history of drug abuse or alcohol abuse problem  kidney disease  lung or breathing disease  suicidal thoughts, plans, or attempt; a previous suicide attempt by you or a family member  an unusual or allergic reaction to pregabalin, gabapentin, other medicines, foods, dyes, or preservatives  pregnant or trying to get pregnant  breast-feeding How should I use this medicine? Take this medicine by mouth with a glass of water. Follow the directions on the prescription label. You can take it with or without food. If it upsets your stomach, take it with food. Take your medicine at regular intervals. Do not take it more often than directed. Do not stop taking except on your doctor's advice. A special MedGuide will be given to you by the pharmacist with each prescription and refill. Be sure to read this information carefully each time. Talk to your pediatrician regarding the use of this medicine in children. While this drug may be prescribed for children as young as 1 month for selected conditions, precautions do apply. Overdosage: If you think you have taken too much of this medicine contact a poison control center or emergency room at once. NOTE: This medicine is only for you. Do not share this medicine with others. What if I miss a dose? If you miss a dose, take it as soon as you can. If it is almost time for your next dose, take only that dose. Do not take double or extra doses. What may  interact with this medicine? This medicine may interact with the following medications:  alcohol  antihistamines for allergy, cough, and cold  certain medicines for anxiety or sleep  certain medicines for depression like amitriptyline, fluoxetine, sertraline  certain medicines for diabetes  certain medicines for seizures like phenobarbital, primidone  general anesthetics like halothane, isoflurane, methoxyflurane, propofol  local anesthetics like lidocaine, pramoxine, tetracaine  medicines that relax muscles for surgery  narcotic medicines for pain  phenothiazines like chlorpromazine, mesoridazine, prochlorperazine, thioridazine This list may not describe all possible interactions. Give your health care provider a list of all the medicines, herbs, non-prescription drugs, or dietary supplements you use. Also tell them if you smoke, drink alcohol, or use illegal drugs. Some items may interact with your medicine. What should I watch for while using this medicine? Tell your doctor or healthcare professional if your symptoms do not start to get better or if they get worse. Visit your doctor or health care professional for regular checks on your progress. Do not stop taking except on your doctor's advice. You may develop a severe reaction. Your doctor will tell you how much medicine to take. Wear a medical identification bracelet or chain if you are taking this medicine for seizures, and carry a card that describes your disease and details of your medicine and dosage times. You may get drowsy or dizzy. Do not drive, use machinery, or do anything that needs mental alertness until you know how this medicine affects you. Do not stand or sit up  quickly, especially if you are an older patient. This reduces the risk of dizzy or fainting spells. Alcohol may interfere with the effect of this medicine. Avoid alcoholic drinks. If you have a heart condition, like congestive heart failure, and notice that  you are retaining water and have swelling in your hands or feet, contact your health care provider immediately. The use of this medicine may increase the chance of suicidal thoughts or actions. Pay special attention to how you are responding while on this medicine. Any worsening of mood, or thoughts of suicide or dying should be reported to your health care professional right away. This medicine has caused reduced sperm counts in some men. This may interfere with the ability to father a child. You should talk to your doctor or health care professional if you are concerned about your fertility. Women who become pregnant while using this medicine for seizures may enroll in the [place] Pregnancy Registry by calling [phone]. This registry collects information about the safety of antiepileptic drug use during pregnancy. What side effects may I notice from receiving this medicine? Side effects that you should report to your doctor or health care professional as soon as possible:  allergic reactions like skin rash, itching or hives, swelling of the face, lips, or tongue  breathing problems  changes in vision  chest pain  confusion  jerking or unusual movements of any part of your body  loss of memory  muscle pain, tenderness, or weakness  suicidal thoughts or other mood changes  swelling of the ankles, feet, hands  unusual bruising or bleeding Side effects that usually do not require medical attention (report to your doctor or health care professional if they continue or are bothersome):  dizziness  drowsiness  dry mouth  headache  nausea  tremors  trouble sleeping  weight gain This list may not describe all possible side effects. Call your doctor for medical advice about side effects. You may report side effects to FDA at 1-800-FDA-1088. Where should I keep my medicine? Keep out of the reach of children. This medicine can be abused. Keep your  medicine in a safe place to protect it from theft. Do not share this medicine with anyone. Selling or giving away this medicine is dangerous and against the law. This medicine may cause accidental overdose and death if it taken by other adults, children, or pets. Mix any unused medicine with a substance like cat litter or coffee grounds. Then throw the medicine away in a sealed container like a sealed bag or a coffee can with a lid. Do not use the medicine after the expiration date. Store at room temperature between 15 and 30 degrees C (59 and 86 degrees F). NOTE: This sheet is a summary. It may not cover all possible information. If you have questions about this medicine, talk to your doctor, pharmacist, or health care provider.  2020 Elsevier/Gold Standard (2018-01-28 13:15:55)

## 2018-11-11 LAB — MAGNESIUM: Magnesium: 2.1 mg/dL (ref 1.6–2.3)

## 2018-11-11 LAB — CALCIUM, IONIZED: Calcium, Ion: 5.1 mg/dL (ref 4.5–5.6)

## 2018-11-13 NOTE — Progress Notes (Signed)
See procedure note.

## 2018-11-13 NOTE — Procedures (Signed)
     Full Name: Farhana Sorn Gender: Female MRN #: [id number] Date of Birth: 06/13/1960    Visit Date: 11/10/2018 07:40 Age: 58 Years 0 Months Old Examining Physician: Athen Riel, MD  Referring Physician: Zelda Fleming, NP  History: This is a 57-year-old female who was sent here for hand pain.  She does have a past medical history of carpal tunnel in the right hand status post release surgery in the 90s.  She is reporting cramping in the hands numbness and tingling.  However her significant symptoms are more related to her muscles, cramping in the hands and legs.  Summary: EMG/NCS was performed on the bilateral upper extremities. All nerves and muscles (as indicated in the following tables) were within normal limits.    Conclusion: This is a normal examination of the upper extremities. No evidence for mononeuropathy, polyneuropathy, cervical radiculopathy or muscle disorder.   Philmore Lepore, M.D.  [facility] [address] [place], [place] [address] Tel: [phone] Fax: [phone]        [facility]    Nerve / Sites Muscle Latency Ref. Amplitude Ref. Rel Amp Segments Distance Velocity Ref. Area    ms ms mV mV %  cm m/s m/s mVms  L Median - APB     Wrist APB 3.8 ?4.4 11.0 ?4.0 100 Wrist - APB 7   44.8     Upper arm APB 7.8  10.3  93 Upper arm - Wrist 22 55 ?49 41.7  R Median - APB     Wrist APB 3.6 ?4.4 10.8 ?4.0 100 Wrist - APB 7   40.3     Upper arm APB 7.8  10.2  94.9 Upper arm - Wrist 22 52 ?49 40.0  L Ulnar - ADM     Wrist ADM 2.9 ?3.3 9.6 ?6.0 100 Wrist - ADM 7   37.2     B.Elbow ADM 6.2  9.3  96.6 B.Elbow - Wrist 19 58 ?49 37.7     A.Elbow ADM 8.0  8.8  94.8 A.Elbow - B.Elbow 10 56 ?49 37.4         A.Elbow - Wrist      R Ulnar - ADM     Wrist ADM 2.8 ?3.3 9.0 ?6.0 100 Wrist - ADM 7   38.3     B.Elbow ADM 6.4  8.6  96 B.Elbow - Wrist 19 53 ?49 39.5     A.Elbow ADM 8.2  8.6  100 A.Elbow - B.Elbow 10 53 ?49 39.8         A.Elbow - Wrist        SNC    Nerve / Sites Rec. Site Peak Lat Ref.  Amp Ref. Segments Distance Peak Diff Ref.    ms ms V V  cm ms ms  L Radial - Anatomical snuff box (Forearm)     Forearm Wrist 2.8 ?2.9 30 ?15 Forearm - Wrist 10    R Radial - Anatomical snuff box (Forearm)     Forearm Wrist 2.4 ?2.9 25 ?15 Forearm - Wrist 10    L Median, Ulnar - Transcarpal comparison     Median Palm Wrist 2.2 ?2.2 55 ?35 Median Palm - Wrist 8       Ulnar Palm Wrist 2.3 ?2.2 15 ?12 Ulnar Palm - Wrist 8          Median Palm - Ulnar Palm  -0.1 ?0.4  R Median, Ulnar - Transcarpal comparison     Median Palm Wrist 2.2 ?  2.2 42 ?35 Median Palm - Wrist 8       Ulnar Palm Wrist 2.2 ?2.2 17 ?12 Ulnar Palm - Wrist 8          Median Palm - Ulnar Palm  -0.1 ?0.4  L Median - Orthodromic (Dig II, Mid palm)     Dig II Wrist 3.1 ?3.4 13 ?10 Dig II - Wrist 13    R Median - Orthodromic (Dig II, Mid palm)     Dig II Wrist 3.1 ?3.4 16 ?10 Dig II - Wrist 13    L Ulnar - Orthodromic, (Dig V, Mid palm)     Dig V Wrist 3.1 ?3.1 7 ?5 Dig V - Wrist 11    R Ulnar - Orthodromic, (Dig V, Mid palm)     Dig V Wrist 3.1 ?3.1 8 ?5 Dig V - Wrist 46                       F  Wave    Nerve F Lat Ref.   ms ms  L Ulnar - ADM 28.0 ?32.0  R Ulnar - ADM 30.1 ?32.0         EMG full       EMG Summary Table    Spontaneous MUAP Recruitment  Muscle IA Fib PSW Fasc Other Amp Dur. Poly Pattern  R. Deltoid Normal None None None _______ Normal Normal Normal Normal  R. Biceps brachii Normal None None None _______ Normal Normal Normal Normal  R. Triceps brachii Normal None None None _______ Normal Normal Normal Normal  R. Pronator teres Normal None None None _______ Normal Normal Normal Normal  R. Extensor indicis proprius Normal None None None _______ Normal Normal Normal Normal  R. First dorsal interosseous Normal None None None _______ Normal Normal Normal Normal  R. Opponens pollicis Normal None None None _______ Normal Normal Normal Normal

## 2018-12-30 ENCOUNTER — Encounter: Payer: Self-pay | Admitting: Nurse Practitioner

## 2019-01-03 ENCOUNTER — Other Ambulatory Visit: Payer: Self-pay | Admitting: Nurse Practitioner

## 2019-01-03 DIAGNOSIS — I10 Essential (primary) hypertension: Secondary | ICD-10-CM

## 2019-01-04 ENCOUNTER — Other Ambulatory Visit: Payer: Self-pay | Admitting: Pharmacist

## 2019-01-04 DIAGNOSIS — I10 Essential (primary) hypertension: Secondary | ICD-10-CM

## 2019-01-04 MED ORDER — AMLODIPINE BESYLATE 5 MG PO TABS: 5.0000 mg | ORAL_TABLET | Freq: Every day | ORAL | 0 refills | Status: DC

## 2019-01-25 ENCOUNTER — Encounter: Payer: Self-pay | Admitting: Nurse Practitioner

## 2019-01-27 ENCOUNTER — Telehealth: Payer: Self-pay | Admitting: *Deleted

## 2019-01-27 NOTE — Telephone Encounter (Signed)
Staff called patient due to patient request of wanting to schedule an appt. Staff was not able to reach patient and LMOM. Staff wanted to schedule appt due to I think that i have a cold or Sinus infection and need to be seen. Office number provided on patient voicemail box

## 2019-01-30 ENCOUNTER — Ambulatory Visit: Payer: Self-pay | Attending: Family Medicine | Admitting: Family Medicine

## 2019-01-30 ENCOUNTER — Encounter: Payer: Self-pay | Admitting: Family Medicine

## 2019-01-30 ENCOUNTER — Other Ambulatory Visit: Payer: Self-pay

## 2019-01-30 DIAGNOSIS — J0110 Acute frontal sinusitis, unspecified: Secondary | ICD-10-CM

## 2019-01-30 MED ORDER — AMOXICILLIN 500 MG PO CAPS: 500.0000 mg | ORAL_CAPSULE | Freq: Three times a day (TID) | ORAL | 0 refills | Status: DC

## 2019-01-30 NOTE — Progress Notes (Signed)
Virtual Visit via Telephone Note  I connected with Audrey Montgomery, on 01/30/2019 at 11:41 AM by telephone due to the COVID-19 pandemic and verified that I am speaking with the correct person using two identifiers.   Consent: I discussed the limitations, risks, security and privacy concerns of performing an evaluation and management service by telephone and the availability of in person appointments. I also discussed with the patient that there may be a patient responsible charge related to this service. The patient expressed understanding and agreed to proceed.   Location of Patient: Environmental education officer of Provider: Clinic   Persons participating in Telemedicine visit: Gwendolin Briel Farrington-CMA Dr. Margarita Rana     History of Present Illness: Audrey Montgomery is a 58 year old female with a history of hypertension seen for an acute visit.  She complains of "a sinus infection". Her whole face hurts, heads hurts, it has been hard for her to swallow and symptoms have been present for 6 day. Endorses associated post nasal drip, cough productive of clear.Denies presence of fever but has had chills; denies history of sick contacts. Used Tylenol, Ibuprofen, Theraflu, OTC sinus medications have been ineffective. She works at [employer] and would need a note to be out of work.  Past Medical History:  Diagnosis Date  . Cataract    Both Eyes.  . Hypertension    No Known Allergies  Current Outpatient Medications on File Prior to Visit  Medication Sig Dispense Refill  . albuterol (PROVENTIL HFA;VENTOLIN HFA) 108 (90 Base) MCG/ACT inhaler Inhale 2 puffs into the lungs every 6 (six) hours as needed for wheezing or shortness of breath. 1 Inhaler 2  . amLODipine (NORVASC) 5 MG tablet Take 1 tablet (5 mg total) by mouth daily. Must have office visit for refills. 30 tablet 0  . diclofenac sodium (VOLTAREN) 1 % GEL Apply 2 g topically 4 (four) times daily. Rub into affected area of foot 2 to 4  times daily 100 g 2  . lisinopril (ZESTRIL) 20 MG tablet Take 1 tablet (20 mg total) by mouth daily. 90 tablet 0  . loperamide (IMODIUM A-D) 2 MG tablet Take 1 tablet (2 mg total) by mouth 4 (four) times daily as needed for diarrhea or loose stools. 30 tablet 0  . benzonatate (TESSALON) 100 MG capsule Take 1 capsule (100 mg total) by mouth 3 (three) times daily as needed for cough. (Patient not taking: Reported on 01/30/2019) 30 capsule 0  . pregabalin (LYRICA) 25 MG capsule Take 1 capsule (25 mg total) by mouth 2 (two) times daily. In one week may increase to 50mg  twice daily. Stop Gabapentin. (Patient not taking: Reported on 01/30/2019) 120 capsule 6  . varenicline (CHANTIX CONTINUING MONTH PAK) 1 MG tablet Take 1 tablet (1 mg total) by mouth 2 (two) times daily. 56 tablet 2   No current facility-administered medications on file prior to visit.    Observations/Objective: Awake, alert, oriented x3 Not in acute distress  Assessment and Plan: 1. Acute non-recurrent frontal sinusitis Failed symptomatic treatment hence we will initiate antibiotics Advised to use amoxicillin in conjunction with nasal irrigation, analgesic as needed, increase fluid intake and rest Note provided for work to return in 2 days. - amoxicillin (AMOXIL) 500 MG capsule; Take 1 capsule (500 mg total) by mouth 3 (three) times daily.  Dispense: 30 capsule; Refill: 0   Follow Up Instructions: Keep appointment with PCP   I discussed the assessment and treatment plan with the patient. The patient was provided an opportunity  to ask questions and all were answered. The patient agreed with the plan and demonstrated an understanding of the instructions.   The patient was advised to call back or seek an in-person evaluation if the symptoms worsen or if the condition fails to improve as anticipated.     I provided 10 minutes total of non-face-to-face time during this encounter including median intraservice time, reviewing  previous notes, labs, imaging, medications, management and patient verbalized understanding.     Hoy Register, MD, FAAFP. [facility] [place], [place] [phone]   01/30/2019, 11:41 AM

## 2019-01-30 NOTE — Progress Notes (Signed)
Patient has been called and DOB has been verified. Patient has been screened and transferred to PCP to start phone visit.   Patient states that she thinks she has a sinus infection.  Patient feels pressure in her head, headache for 1 week.

## 2019-02-07 ENCOUNTER — Other Ambulatory Visit: Payer: Self-pay | Admitting: Family Medicine

## 2019-02-07 ENCOUNTER — Encounter: Payer: Self-pay | Admitting: Nurse Practitioner

## 2019-02-07 ENCOUNTER — Other Ambulatory Visit: Payer: Self-pay | Admitting: Nurse Practitioner

## 2019-02-07 DIAGNOSIS — I10 Essential (primary) hypertension: Secondary | ICD-10-CM

## 2019-02-07 MED ORDER — AMLODIPINE BESYLATE 5 MG PO TABS: 5.0000 mg | ORAL_TABLET | Freq: Every day | ORAL | 0 refills | Status: DC

## 2019-02-07 MED ORDER — LISINOPRIL 20 MG PO TABS: 20.0000 mg | ORAL_TABLET | Freq: Every day | ORAL | 0 refills | Status: DC

## 2019-03-03 ENCOUNTER — Ambulatory Visit: Payer: Self-pay | Attending: Nurse Practitioner | Admitting: Nurse Practitioner

## 2019-03-03 ENCOUNTER — Other Ambulatory Visit: Payer: Self-pay

## 2019-03-03 ENCOUNTER — Encounter: Payer: Self-pay | Admitting: Nurse Practitioner

## 2019-03-03 DIAGNOSIS — R82998 Other abnormal findings in urine: Secondary | ICD-10-CM

## 2019-03-03 DIAGNOSIS — E785 Hyperlipidemia, unspecified: Secondary | ICD-10-CM

## 2019-03-03 DIAGNOSIS — I10 Essential (primary) hypertension: Secondary | ICD-10-CM

## 2019-03-03 NOTE — Progress Notes (Signed)
Virtual Visit via Telephone Note Due to national recommendations of social distancing due to [place] 19, telehealth visit is felt to be most appropriate for this patient at this time.  I discussed the limitations, risks, security and privacy concerns of performing an evaluation and management service by telephone and the availability of in person appointments. I also discussed with the patient that there may be a patient responsible charge related to this service. The patient expressed understanding and agreed to proceed.    I connected with Audrey Montgomery on 03/03/19  at   3:50 PM EST  EDT by telephone and verified that I am speaking with the correct person using two identifiers.   Consent I discussed the limitations, risks, security and privacy concerns of performing an evaluation and management service by telephone and the availability of in person appointments. I also discussed with the patient that there may be a patient responsible charge related to this service. The patient expressed understanding and agreed to proceed.   Location of Patient: Private Residence   Location of Provider: [place] participating in Telemedicine visit: Geryl Rankins FNP-BC [place]    History of Present Illness: Telemedicine visit for: F/U  has a past medical history of Cataract and Hypertension.    Essential Hypertension Well controlled. She is currently taking amlodipine 5 mg daily and lisinopril 20 mg. Denies chest pain, shortness of breath, palpitations, lightheadedness, dizziness, headaches or BLE edema. She does endorse shortness of breath however this is with increased activity. She has lost 8 lbs. She is increasing her activity to help lose weight. .  BP Readings from Last 3 Encounters:  10/10/18 112/78  05/30/18 (!) 141/100  04/27/18 131/79      Past Medical History:  Diagnosis Date  . Cataract    Both Eyes.  .  Hypertension     Past Surgical History:  Procedure Laterality Date  . APPENDECTOMY      Family History  Problem Relation Age of Onset  . Stroke Brother   . Peripheral vascular disease Other        father's side  . Neuropathy Neg Hx        that she knows of     Social History   Socioeconomic History  . Marital status: Widowed    Spouse name: Not on file  . Number of children: Not on file  . Years of education: Not on file  . Highest education level: Not on file  Occupational History  . Not on file  Tobacco Use  . Smoking status: Former Smoker    Packs/day: 0.50    Types: Cigarettes    Quit date: 12/25/2017    Years since quitting: 1.1  . Smokeless tobacco: Never Used  . Tobacco comment: Pt. stated she stopped smoking for a month.  04/27/2018  Substance and Sexual Activity  . Alcohol use: Not Currently  . Drug use: No  . Sexual activity: Yes  Other Topics Concern  . Not on file  Social History Narrative   Lives at home with her brother. He had a stroke and she cares for him.   Right handed   Social Determinants of Health   Financial Resource Strain:   . Difficulty of Paying Living Expenses: Not on file  Food Insecurity:   . Worried About Charity fundraiser in the Last Year: Not on file  . Ran Out of Food in the Last Year: Not  on file  Transportation Needs:   . Lack of Transportation (Medical): Not on file  . Lack of Transportation (Non-Medical): Not on file  Physical Activity:   . Days of Exercise per Week: Not on file  . Minutes of Exercise per Session: Not on file  Stress:   . Feeling of Stress : Not on file  Social Connections:   . Frequency of Communication with Friends and Family: Not on file  . Frequency of Social Gatherings with Friends and Family: Not on file  . Attends Religious Services: Not on file  . Active Member of Clubs or Organizations: Not on file  . Attends Archivist Meetings: Not on file  . Marital Status: Not on file      Observations/Objective: Awake, alert and oriented x 3   Review of Systems  Constitutional: Negative for fever, malaise/fatigue and weight loss.  HENT: Negative.  Negative for nosebleeds.   Eyes: Negative.  Negative for blurred vision, double vision and photophobia.  Respiratory: Negative.  Negative for cough and shortness of breath.   Cardiovascular: Negative.  Negative for chest pain, palpitations and leg swelling.  Gastrointestinal: Negative.  Negative for heartburn, nausea and vomiting.  Musculoskeletal: Negative.  Negative for myalgias.  Neurological: Negative.  Negative for dizziness, focal weakness, seizures and headaches.  Psychiatric/Behavioral: Negative.  Negative for suicidal ideas.    Assessment and Plan: Audrey Montgomery was seen today for follow-up.  Diagnoses and all orders for this visit:  Essential hypertension -     CMP14+EGFR; Future Continue all antihypertensives as prescribed.  Remember to bring in your blood pressure log with you for your follow up appointment.  DASH/Mediterranean Diets are healthier choices for HTN.    Dark urine -     POCT URINALYSIS DIP (CLINITEK); Future  Dyslipidemia, goal LDL below 100 -     Lipid panel; Future INSTRUCTIONS: Work on a low fat, heart healthy diet and participate in regular aerobic exercise program by working out at least 150 minutes per week; 5 days a week-30 minutes per day. Avoid red meat/beef/steak,  fried foods. junk foods, sodas, sugary drinks, unhealthy snacking, alcohol and smoking.  Drink at least 80 oz of water per day and monitor your carbohydrate intake daily.       Follow Up Instructions Return in about 3 months (around 06/01/2019).     I discussed the assessment and treatment plan with the patient. The patient was provided an opportunity to ask questions and all were answered. The patient agreed with the plan and demonstrated an understanding of the instructions.   The patient was advised to call back or  seek an in-person evaluation if the symptoms worsen or if the condition fails to improve as anticipated.  I provided 13 minutes of non-face-to-face time during this encounter including median intraservice time, reviewing previous notes, labs, imaging, medications and explaining diagnosis and management.  [name], FNP-BC

## 2019-03-07 ENCOUNTER — Other Ambulatory Visit: Payer: Self-pay

## 2019-03-07 ENCOUNTER — Ambulatory Visit: Payer: Self-pay | Attending: Nurse Practitioner

## 2019-03-07 DIAGNOSIS — R82998 Other abnormal findings in urine: Secondary | ICD-10-CM

## 2019-03-07 DIAGNOSIS — I10 Essential (primary) hypertension: Secondary | ICD-10-CM

## 2019-03-07 DIAGNOSIS — E785 Hyperlipidemia, unspecified: Secondary | ICD-10-CM

## 2019-03-07 LAB — POCT URINALYSIS DIP (CLINITEK)
Bilirubin, UA: NEGATIVE
Glucose, UA: NEGATIVE mg/dL
Ketones, POC UA: NEGATIVE mg/dL
Nitrite, UA: NEGATIVE
POC PROTEIN,UA: NEGATIVE
Spec Grav, UA: 1.01 (ref 1.010–1.025)
Urobilinogen, UA: 0.2 E.U./dL
pH, UA: 5.5 (ref 5.0–8.0)

## 2019-03-08 LAB — CMP14+EGFR
ALT: 13 IU/L (ref 0–32)
AST: 17 IU/L (ref 0–40)
Albumin/Globulin Ratio: 2 (ref 1.2–2.2)
Albumin: 4.7 g/dL (ref 3.8–4.9)
Alkaline Phosphatase: 87 IU/L (ref 39–117)
BUN/Creatinine Ratio: 14 (ref 9–23)
BUN: 14 mg/dL (ref 6–24)
Bilirubin Total: <0.2 mg/dL (ref 0.0–1.2)
CO2: 23 mmol/L (ref 20–29)
Calcium: 9.6 mg/dL (ref 8.7–10.2)
Chloride: 107 mmol/L — ABNORMAL HIGH (ref 96–106)
Creatinine, Ser: 1.03 mg/dL — ABNORMAL HIGH (ref 0.57–1.00)
GFR calc Af Amer: 69 mL/min/{1.73_m2} (ref >59)
GFR calc non Af Amer: 60 mL/min/{1.73_m2} (ref >59)
Globulin, Total: 2.3 g/dL (ref 1.5–4.5)
Glucose: 84 mg/dL (ref 65–99)
Potassium: 3.7 mmol/L (ref 3.5–5.2)
Sodium: 144 mmol/L (ref 134–144)
Total Protein: 7 g/dL (ref 6.0–8.5)

## 2019-03-08 LAB — LIPID PANEL
Chol/HDL Ratio: 1.7 ratio (ref 0.0–4.4)
Cholesterol, Total: 187 mg/dL (ref 100–199)
HDL: 113 mg/dL (ref >39)
LDL Chol Calc (NIH): 47 mg/dL (ref 0–99)
Triglycerides: 175 mg/dL — ABNORMAL HIGH (ref 0–149)
VLDL Cholesterol Cal: 27 mg/dL (ref 5–40)

## 2019-03-10 LAB — URINE CULTURE

## 2019-03-11 ENCOUNTER — Other Ambulatory Visit: Payer: Self-pay | Admitting: Nurse Practitioner

## 2019-03-11 MED ORDER — SULFAMETHOXAZOLE-TRIMETHOPRIM 800-160 MG PO TABS: 1.0000 | ORAL_TABLET | Freq: Two times a day (BID) | ORAL | 0 refills | Status: AC

## 2019-03-14 ENCOUNTER — Encounter: Payer: Self-pay | Admitting: Nurse Practitioner

## 2019-03-14 ENCOUNTER — Other Ambulatory Visit: Payer: Self-pay | Admitting: Nurse Practitioner

## 2019-03-14 DIAGNOSIS — I10 Essential (primary) hypertension: Secondary | ICD-10-CM

## 2019-03-14 MED ORDER — LISINOPRIL 20 MG PO TABS: 20.0000 mg | ORAL_TABLET | Freq: Every day | ORAL | 0 refills | Status: DC

## 2019-03-14 MED ORDER — AMLODIPINE BESYLATE 5 MG PO TABS: 5.0000 mg | ORAL_TABLET | Freq: Every day | ORAL | 0 refills | Status: DC

## 2019-06-08 ENCOUNTER — Encounter: Payer: Self-pay | Admitting: Nurse Practitioner

## 2019-06-08 DIAGNOSIS — I10 Essential (primary) hypertension: Secondary | ICD-10-CM

## 2019-06-09 MED ORDER — LISINOPRIL 20 MG PO TABS: 20.0000 mg | ORAL_TABLET | Freq: Every day | ORAL | 0 refills | Status: DC

## 2019-06-09 MED ORDER — AMLODIPINE BESYLATE 5 MG PO TABS: 5.0000 mg | ORAL_TABLET | Freq: Every day | ORAL | 0 refills | Status: DC

## 2019-06-13 ENCOUNTER — Encounter: Payer: Self-pay | Admitting: Nurse Practitioner

## 2019-06-13 DIAGNOSIS — I10 Essential (primary) hypertension: Secondary | ICD-10-CM

## 2019-06-14 MED ORDER — LISINOPRIL 20 MG PO TABS: 20.0000 mg | ORAL_TABLET | Freq: Every day | ORAL | 0 refills | Status: DC

## 2019-07-05 IMAGING — CR DG CHEST 2V
2 series · 2 of 2 positions shown · non-contrast
Comparison: None.

CLINICAL DATA: Fever for 2 days.

EXAM:
CHEST - 2 VIEW

[chest pa]
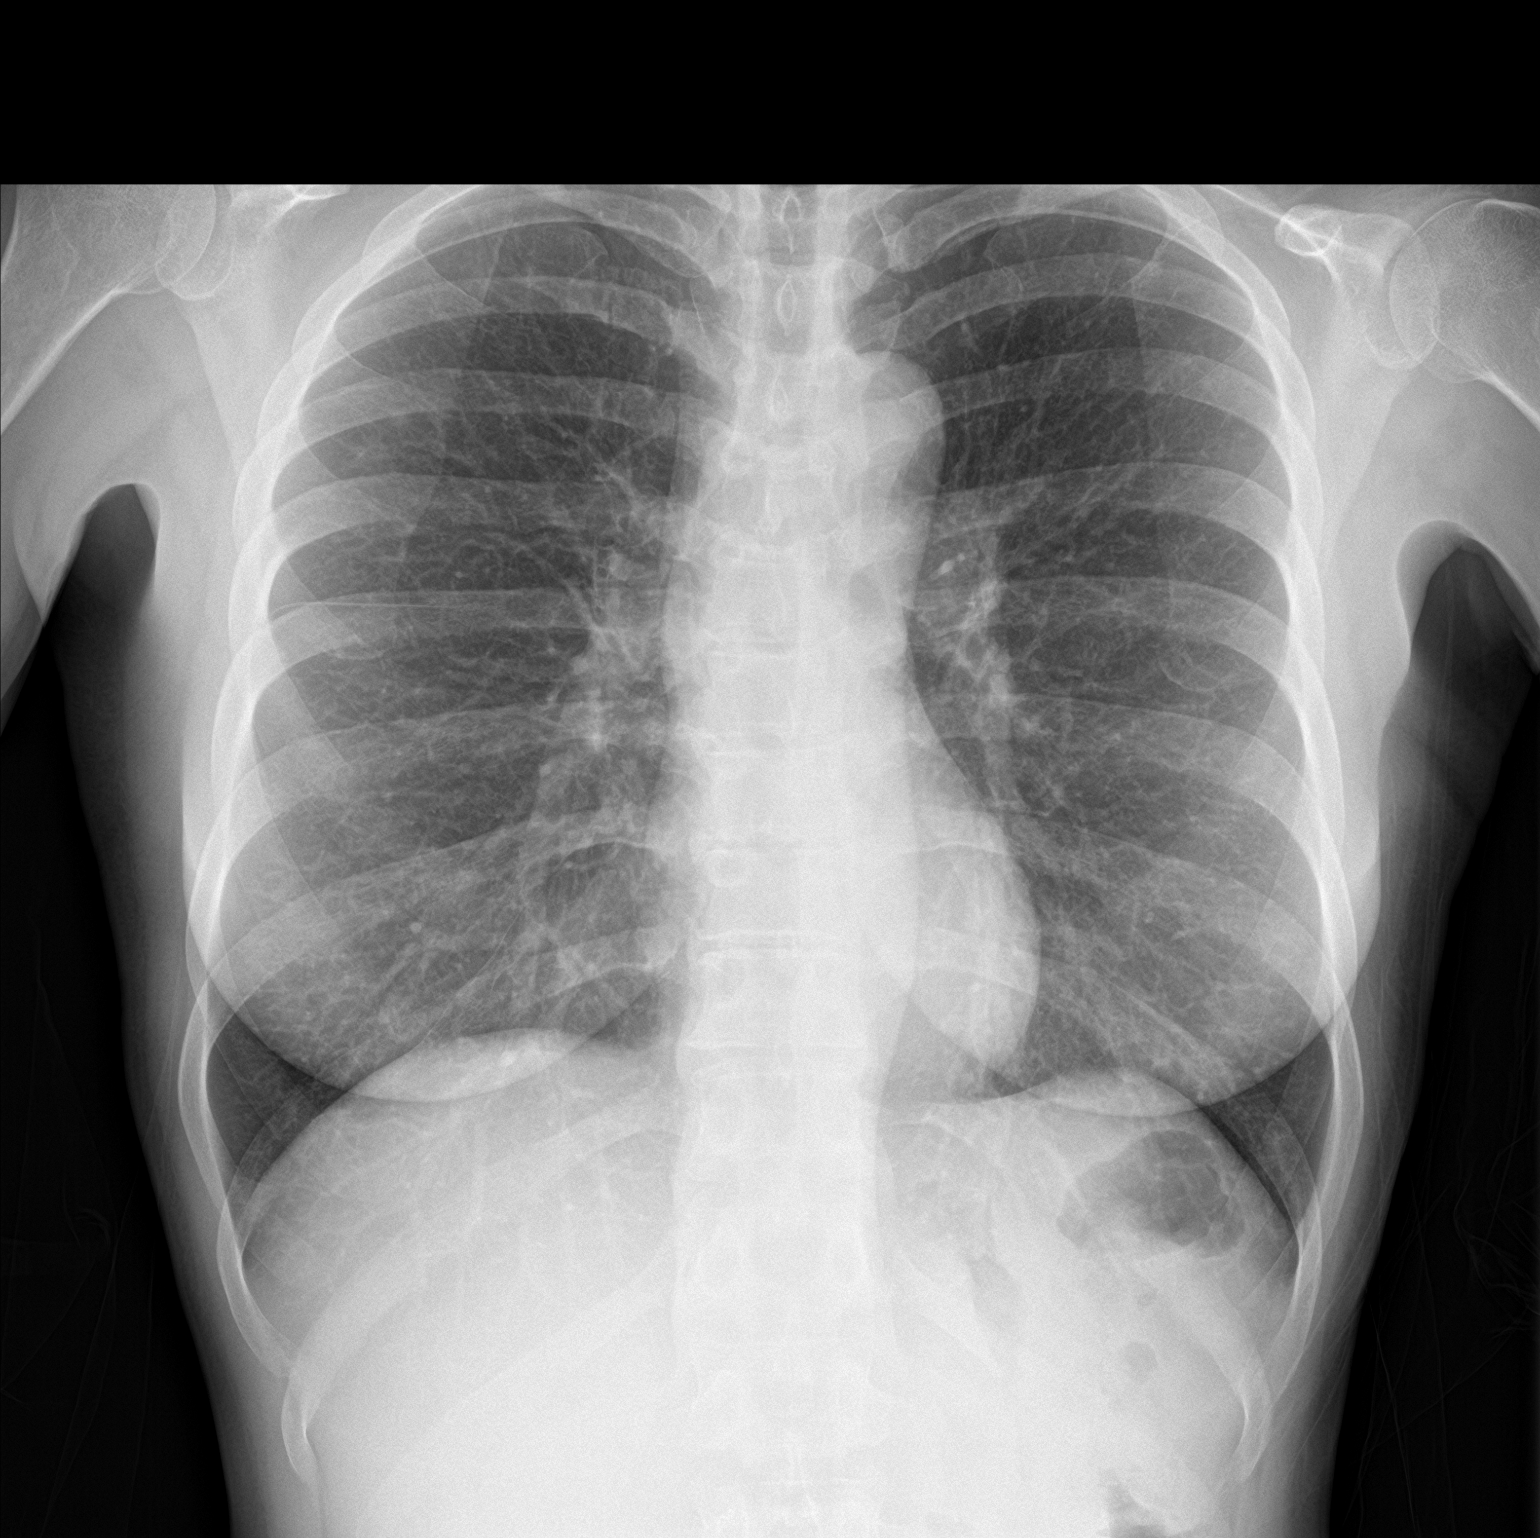

[chest lat]
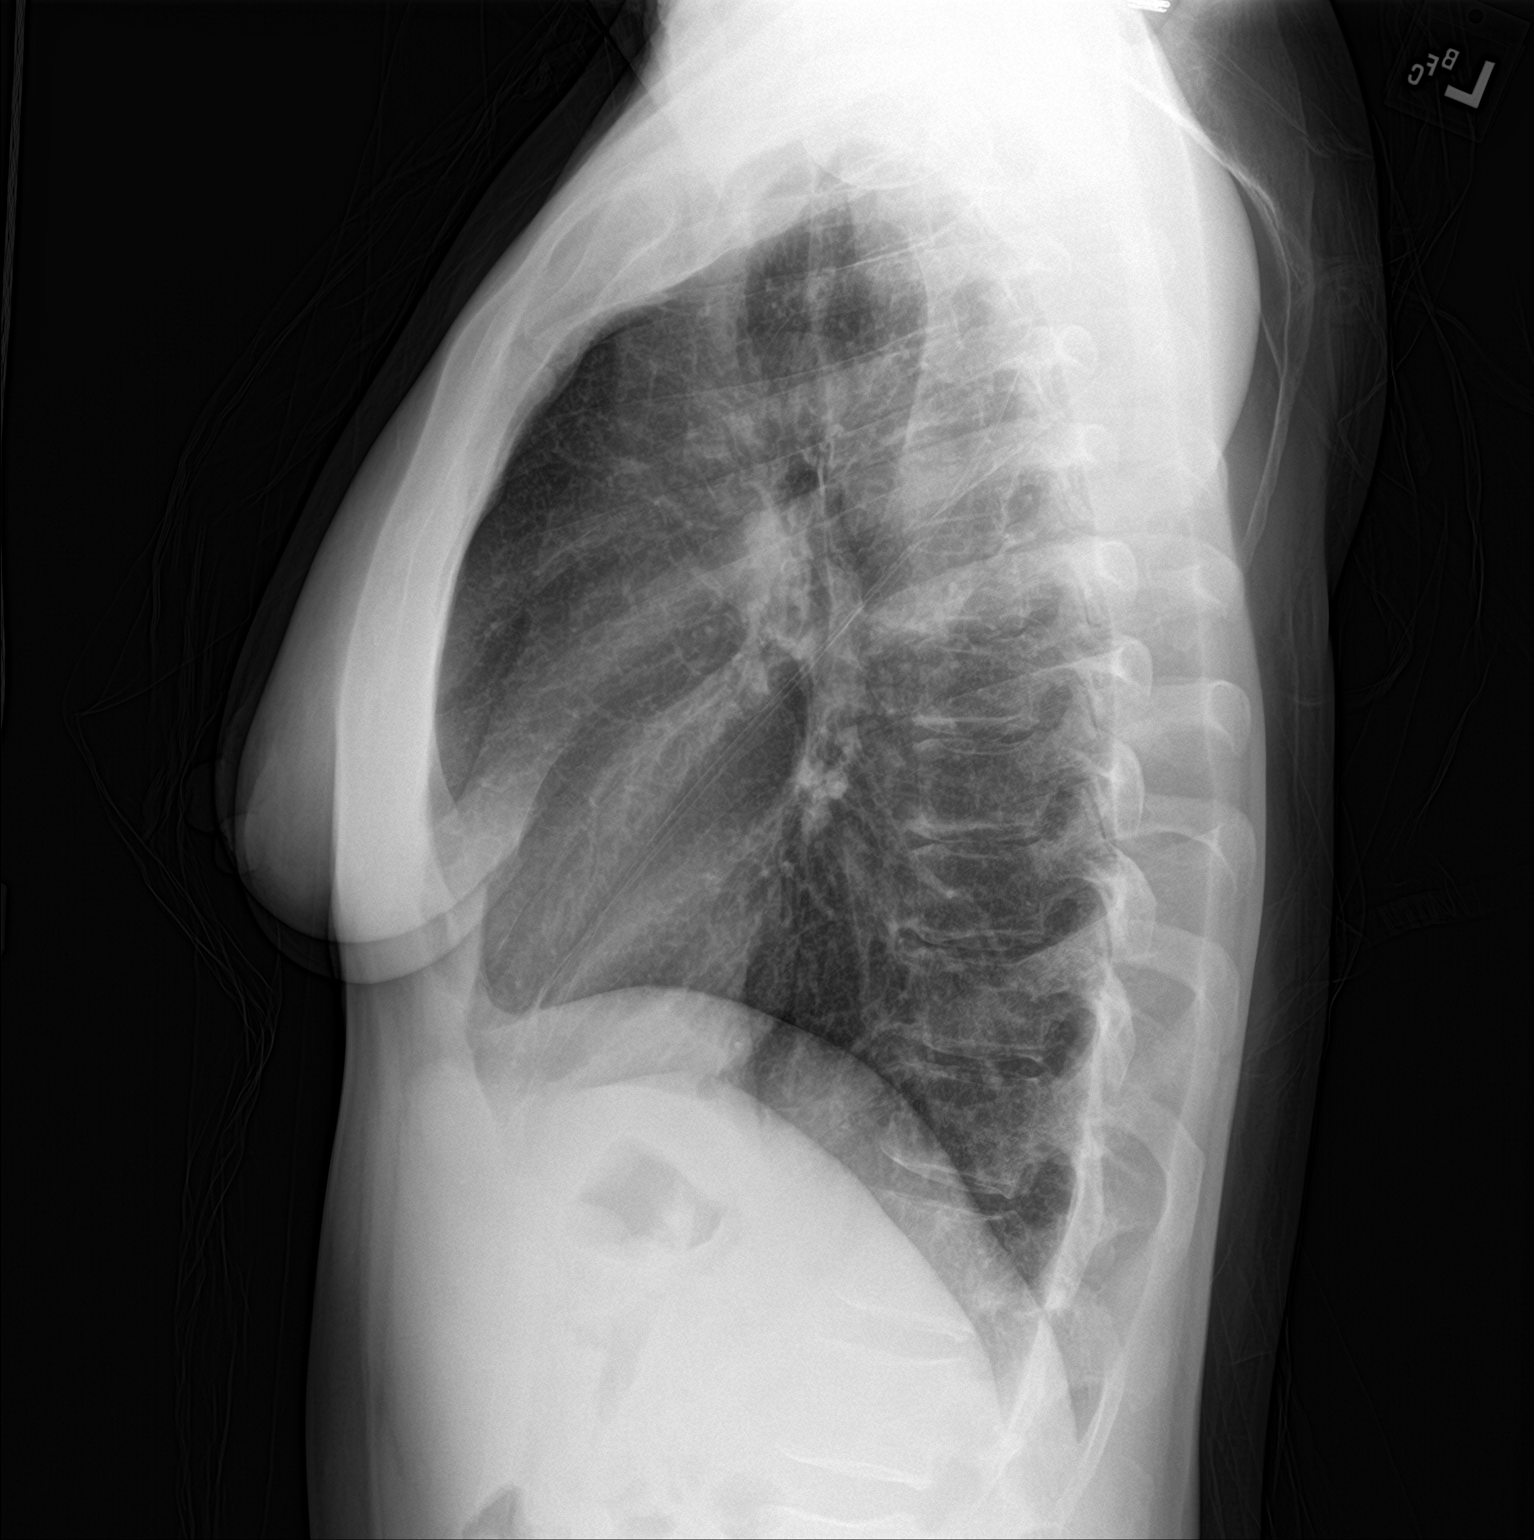

[2 of 2 positions shown; findings below may reference images not displayed]

FINDINGS: The heart size and mediastinal contours are within normal limits.
Both lungs are clear. The visualized skeletal structures are
unremarkable.
IMPRESSION: No active cardiopulmonary disease.

## 2019-08-22 ENCOUNTER — Other Ambulatory Visit: Payer: Self-pay | Admitting: Family Medicine

## 2019-08-22 DIAGNOSIS — I10 Essential (primary) hypertension: Secondary | ICD-10-CM

## 2019-08-22 MED ORDER — AMLODIPINE BESYLATE 5 MG PO TABS: 5.0000 mg | ORAL_TABLET | Freq: Every day | ORAL | 0 refills | Status: DC

## 2019-08-22 MED ORDER — LISINOPRIL 20 MG PO TABS: 20.0000 mg | ORAL_TABLET | Freq: Every day | ORAL | 0 refills | Status: DC

## 2019-08-22 NOTE — Telephone Encounter (Signed)
Please fill if appropriate.  

## 2019-09-13 ENCOUNTER — Encounter: Payer: Self-pay | Admitting: Nurse Practitioner

## 2019-09-14 ENCOUNTER — Other Ambulatory Visit: Payer: Self-pay | Admitting: Nurse Practitioner

## 2019-09-14 DIAGNOSIS — I10 Essential (primary) hypertension: Secondary | ICD-10-CM

## 2019-09-14 MED ORDER — LISINOPRIL 20 MG PO TABS: 20.0000 mg | ORAL_TABLET | Freq: Every day | ORAL | 0 refills | Status: DC

## 2019-09-14 MED ORDER — AMLODIPINE BESYLATE 5 MG PO TABS: 5.0000 mg | ORAL_TABLET | Freq: Every day | ORAL | 0 refills | Status: DC

## 2019-10-12 ENCOUNTER — Encounter: Payer: Self-pay | Admitting: Physician Assistant

## 2019-10-12 ENCOUNTER — Ambulatory Visit: Payer: Self-pay | Attending: Physician Assistant | Admitting: Physician Assistant

## 2019-10-12 DIAGNOSIS — I10 Essential (primary) hypertension: Secondary | ICD-10-CM

## 2019-10-12 MED ORDER — AMLODIPINE BESYLATE 10 MG PO TABS: 10.0000 mg | ORAL_TABLET | Freq: Every day | ORAL | 3 refills | Status: DC

## 2019-10-12 MED ORDER — LISINOPRIL 20 MG PO TABS: 20.0000 mg | ORAL_TABLET | Freq: Every day | ORAL | 1 refills | Status: DC

## 2019-10-12 NOTE — Progress Notes (Signed)
Virtual Visit via Telephone Note  I connected with Audrey Montgomery on 10/12/19 at  3:30 PM EDT by telephone and verified that I am speaking with the correct person using two identifiers.   I discussed the limitations, risks, security and privacy concerns of performing an evaluation and management service by telephone and the availability of in person appointments. I also discussed with the patient that there may be a patient responsible charge related to this service. The patient expressed understanding and agreed to proceed.   PATIENT visit by telephone virtually in the context of Covid-19 pandemic. Patient location:  home My Location:  [facility] office Persons on the call:  Me and the patient   History of Present Illness:  Compliant with BP meds.  Usually when she checks her BP at home she gets 150s/90s.  She does c/o frequent HA.  No vision change.  No CP/SOB.      Observations/Objective:  NAD.  A&Ox3   Assessment and Plan: 1. Essential hypertension Uncontrolled with HA-increase dose amlodipine from 5 to 10mg  and continue lisinopril.  Check BP 3-4 times weekly and we can do televisit with in 1 month - lisinopril (ZESTRIL) 20 MG tablet; Take 1 tablet (20 mg total) by mouth daily.  Dispense: 90 tablet; Refill: 1 - amLODipine (NORVASC) 10 MG tablet; Take 1 tablet (10 mg total) by mouth daily.  Dispense: 90 tablet; Refill: 3   Follow Up Instructions: televisit with Luke in 1 month to review BP;  2-3 months with PCP   I discussed the assessment and treatment plan with the patient. The patient was provided an opportunity to ask questions and all were answered. The patient agreed with the plan and demonstrated an understanding of the instructions.   The patient was advised to call back or seek an in-person evaluation if the symptoms worsen or if the condition fails to improve as anticipated.  I provided 15 minutes of non-face-to-face time during this encounter.   Franky Macho,  PA-C  Patient ID: Audrey Montgomery, female   DOB: Jan 27, 1961, 59 y.o.   MRN: [id number]

## 2019-10-12 NOTE — Progress Notes (Signed)
Needing refills on medications.

## 2019-11-13 ENCOUNTER — Ambulatory Visit: Payer: Self-pay | Attending: Nurse Practitioner | Admitting: Pharmacist

## 2019-11-13 ENCOUNTER — Encounter: Payer: Self-pay | Admitting: Pharmacist

## 2019-11-13 ENCOUNTER — Other Ambulatory Visit: Payer: Self-pay

## 2019-11-13 VITALS — BP 120/85

## 2019-11-13 DIAGNOSIS — I10 Essential (primary) hypertension: Secondary | ICD-10-CM

## 2019-11-13 NOTE — Progress Notes (Signed)
   S:    Patient arrives in good spirits. Presents to the clinic for hypertension evaluation, counseling, and management. Patient was referred on 10/12/2019 by [employer] [employer]    Medication adherence reported.  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 No values reported  Last 3 Office BP readings: BP Readings from Last 3 Encounters:  11/13/19 120/85  10/10/18 112/78  05/30/18 (!) 141/100    BMET    Component Value Date/Time   NA 144 03/07/2019 1511   K 3.7 03/07/2019 1511   CL 107 (H) 03/07/2019 1511   CO2 23 03/07/2019 1511   GLUCOSE 84 03/07/2019 1511   GLUCOSE 128 (H) 08/11/2017 0522   BUN 14 03/07/2019 1511   CREATININE 1.03 (H) 03/07/2019 1511   CALCIUM 9.6 03/07/2019 1511   GFRNONAA 60 03/07/2019 1511   GFRAA 69 03/07/2019 1511    Renal function: CrCl cannot be calculated (Patient's most recent lab result is older than the maximum 21 days allowed.).  Clinical ASCVD: No  The ASCVD Risk score Denman George DC Jr., et al., 2013) failed to calculate for the following reasons:   The valid HDL cholesterol range is 20 to 100 mg/dL  A/P: Hypertension longstanding currently close to goal on current medications. BP Goal = < 130/80 mmHg. Medication adherence reported.  -Continued current regimen.  -Counseled on lifestyle modifications for blood pressure control including reduced dietary sodium, increased exercise, adequate sleep.  Results reviewed and written information provided.   Total time in face-to-face counseling 15 minutes.   F/U Clinic  Visit with PCP.    Butch Penny, PharmD, CPP Clinical Pharmacist [facility] & [facility] [phone]

## 2020-01-12 ENCOUNTER — Ambulatory Visit: Payer: Medicaid Other | Admitting: Nurse Practitioner

## 2020-02-14 ENCOUNTER — Ambulatory Visit: Payer: Self-pay | Attending: Nurse Practitioner | Admitting: Nurse Practitioner

## 2020-02-14 ENCOUNTER — Other Ambulatory Visit: Payer: Self-pay

## 2020-02-14 ENCOUNTER — Encounter: Payer: Self-pay | Admitting: Nurse Practitioner

## 2020-02-14 VITALS — BP 129/81 | HR 95 | Temp 98.4°F | Ht 68.0 in | Wt 204.4 lb

## 2020-02-14 DIAGNOSIS — I10 Essential (primary) hypertension: Secondary | ICD-10-CM

## 2020-02-14 DIAGNOSIS — J0110 Acute frontal sinusitis, unspecified: Secondary | ICD-10-CM

## 2020-02-14 DIAGNOSIS — J410 Simple chronic bronchitis: Secondary | ICD-10-CM

## 2020-02-14 MED ORDER — FLUTICASONE PROPIONATE 50 MCG/ACT NA SUSP
2.0000 | Freq: Every day | NASAL | 6 refills | Status: DC
Start: 2020-02-14 — End: 2020-02-14

## 2020-02-14 MED ORDER — ALBUTEROL SULFATE HFA 108 (90 BASE) MCG/ACT IN AERS: 2.0000 | INHALATION_SPRAY | Freq: Four times a day (QID) | RESPIRATORY_TRACT | 2 refills | Status: DC | PRN

## 2020-02-14 MED ORDER — AMOXICILLIN-POT CLAVULANATE 875-125 MG PO TABS: 1.0000 | ORAL_TABLET | Freq: Two times a day (BID) | ORAL | 0 refills | Status: AC

## 2020-02-14 NOTE — Progress Notes (Signed)
Assessment & Plan:  Audrey Montgomery was seen today for follow-up.  Diagnoses and all orders for this visit:  Acute non-recurrent frontal sinusitis -     amoxicillin-clavulanate (AUGMENTIN) 875-125 MG tablet; Take 1 tablet by mouth 2 (two) times daily for 5 days.  Chronic Bronchitis -     albuterol (VENTOLIN HFA) 108 (90 Base) MCG/ACT inhaler; Inhale 2 puffs into the lungs every 6 (six) hours as needed for wheezing or shortness of breath.   Essential Hypertension Continue amlodipine 10 mg and lisinopril 20 mg  daily as prescribed.  Remember to bring in your blood pressure log with you for your follow up appointment.  DASH/Mediterranean Diets are healthier choices for HTN.    Patient has been counseled on age-appropriate routine health concerns for screening and prevention. These are reviewed and up-to-date. Referrals have been placed accordingly. Immunizations are up-to-date or declined.    Subjective:   Chief Complaint  Patient presents with  . Follow-up    Pt. Is here for 3 months follow up.    HPI Audrey Montgomery 60 y.o. female presents to office today follow up. She has a history of HTN, COVID, tobacco abuse and has complaints today of sinusitis.    She had a work related injury in November. States she fell at work in November and injured her knee. She is currently waiting for workmen's comp to approve referral to the orthopedist.   Sinus Pain: Patient complains of facial pain, nasal congestion, post nasal drip, headache, and sinus pressure. Onset of symptoms was several days ago, gradually worsening since that time. She is drinking plenty of fluids.  Past history is significant for chronic bronchitis and COVID. Patient is a smoker.   Essential Hypertension Well controlled. Last home reading was 109/87. Denies chest pain, shortness of breath, palpitations, lightheadedness, dizziness, headaches or BLE edema.  BP Readings from Last 3 Encounters:  02/14/20 129/81  11/13/19 120/85   10/10/18 112/78    Review of Systems  Constitutional: Negative for fever, malaise/fatigue and weight loss.  HENT: Positive for congestion and sinus pain. Negative for ear pain and nosebleeds.   Eyes: Negative.  Negative for blurred vision, double vision and photophobia.  Respiratory: Negative.  Negative for cough and shortness of breath.   Cardiovascular: Negative.  Negative for chest pain, palpitations and leg swelling.  Gastrointestinal: Negative.  Negative for heartburn, nausea and vomiting.  Musculoskeletal: Positive for joint pain. Negative for myalgias.  Neurological: Positive for headaches. Negative for dizziness, focal weakness and seizures.  Psychiatric/Behavioral: Negative.  Negative for suicidal ideas.    Past Medical History:  Diagnosis Date  . Cataract    Both Eyes.  . Hypertension     Past Surgical History:  Procedure Laterality Date  . APPENDECTOMY      Family History  Problem Relation Age of Onset  . Stroke Brother   . Peripheral vascular disease Other        father's side  . Neuropathy Neg Hx        that she knows of     Social History Reviewed with no changes to be made today.   Outpatient Medications Prior to Visit  Medication Sig Dispense Refill  . amLODipine (NORVASC) 10 MG tablet Take 1 tablet (10 mg total) by mouth daily. 90 tablet 3  . loperamide (IMODIUM A-D) 2 MG tablet Take 1 tablet (2 mg total) by mouth 4 (four) times daily as needed for diarrhea or loose stools. 30 tablet 0  . albuterol (PROVENTIL  HFA;VENTOLIN HFA) 108 (90 Base) MCG/ACT inhaler Inhale 2 puffs into the lungs every 6 (six) hours as needed for wheezing or shortness of breath. 1 Inhaler 2  . diclofenac sodium (VOLTAREN) 1 % GEL Apply 2 g topically 4 (four) times daily. Rub into affected area of foot 2 to 4 times daily (Patient not taking: No sig reported) 100 g 2  . lisinopril (ZESTRIL) 20 MG tablet Take 1 tablet (20 mg total) by mouth daily. 90 tablet 1   No  facility-administered medications prior to visit.    No Known Allergies     Objective:    BP 129/81 (BP Location: Left Arm, Patient Position: Sitting, Cuff Size: Normal)   Pulse 95   Temp 98.4 F (36.9 C) (Oral)   Ht 5\' 8"  (1.727 m)   Wt 204 lb 6.4 oz (92.7 kg)   LMP  (LMP Unknown)   SpO2 94%   BMI 31.08 kg/m  Wt Readings from Last 3 Encounters:  02/14/20 204 lb 6.4 oz (92.7 kg)  10/10/18 198 lb (89.8 kg)  04/27/18 178 lb (80.7 kg)    Physical Exam Vitals and nursing note reviewed.  Constitutional:      Appearance: She is well-developed and well-nourished.  HENT:     Head: Normocephalic and atraumatic.     Nose:     Right Turbinates: Enlarged and swollen.     Left Turbinates: Enlarged and swollen.     Right Sinus: Maxillary sinus tenderness present.     Left Sinus: Maxillary sinus tenderness present.  Eyes:     Extraocular Movements: EOM normal.  Cardiovascular:     Rate and Rhythm: Normal rate and regular rhythm.     Pulses: Intact distal pulses.     Heart sounds: Normal heart sounds. No murmur heard. No friction rub. No gallop.   Pulmonary:     Effort: Pulmonary effort is normal. No tachypnea or respiratory distress.     Breath sounds: Normal breath sounds. No decreased breath sounds, wheezing, rhonchi or rales.  Chest:     Chest wall: No tenderness.  Abdominal:     General: Bowel sounds are normal.     Palpations: Abdomen is soft.  Musculoskeletal:        General: No edema. Normal range of motion.     Cervical back: Normal range of motion.  Skin:    General: Skin is warm and dry.  Neurological:     Mental Status: She is alert and oriented to person, place, and time.     Coordination: Coordination normal.  Psychiatric:        Mood and Affect: Mood and affect normal.        Behavior: Behavior normal. Behavior is cooperative.        Thought Content: Thought content normal.        Judgment: Judgment normal.          Patient has been counseled  extensively about nutrition and exercise as well as the importance of adherence with medications and regular follow-up. The patient was given clear instructions to go to ER or return to medical center if symptoms don't improve, worsen or new problems develop. The patient verbalized understanding.   Follow-up: Return in about 3 months (around 05/14/2020).   07/14/2020, FNP-BC [facility] [place], [place]   02/14/2020, 10:30 PM

## 2020-03-08 ENCOUNTER — Encounter: Payer: Self-pay | Admitting: Nurse Practitioner

## 2020-04-22 ENCOUNTER — Other Ambulatory Visit: Payer: Self-pay | Admitting: Physician Assistant

## 2020-04-22 DIAGNOSIS — I10 Essential (primary) hypertension: Secondary | ICD-10-CM

## 2020-04-22 NOTE — Telephone Encounter (Signed)
Pt is requesting a refill on medication. Mediation was last prescribed by [facility] on 10/12/19.

## 2020-08-07 DIAGNOSIS — M25562 Pain in left knee: Secondary | ICD-10-CM | POA: Insufficient documentation

## 2020-10-08 ENCOUNTER — Other Ambulatory Visit: Payer: Self-pay | Admitting: Physician Assistant

## 2020-10-08 DIAGNOSIS — I10 Essential (primary) hypertension: Secondary | ICD-10-CM

## 2020-10-08 NOTE — Telephone Encounter (Signed)
Please refill if appropriate

## 2020-11-01 ENCOUNTER — Encounter: Payer: Self-pay | Admitting: Nurse Practitioner

## 2020-11-01 ENCOUNTER — Other Ambulatory Visit: Payer: Self-pay | Admitting: Nurse Practitioner

## 2020-11-01 ENCOUNTER — Other Ambulatory Visit: Payer: Self-pay | Admitting: Physician Assistant

## 2020-11-01 DIAGNOSIS — I10 Essential (primary) hypertension: Secondary | ICD-10-CM

## 2020-11-01 MED ORDER — AMLODIPINE BESYLATE 10 MG PO TABS: 10.0000 mg | ORAL_TABLET | Freq: Every day | ORAL | 0 refills | Status: DC

## 2020-11-01 MED ORDER — LISINOPRIL 20 MG PO TABS: 20.0000 mg | ORAL_TABLET | Freq: Every day | ORAL | 0 refills | Status: DC

## 2020-12-02 ENCOUNTER — Other Ambulatory Visit: Payer: Self-pay

## 2020-12-02 ENCOUNTER — Encounter: Payer: Self-pay | Admitting: Nurse Practitioner

## 2020-12-02 ENCOUNTER — Ambulatory Visit: Payer: BC Managed Care – PPO | Attending: Nurse Practitioner | Admitting: Nurse Practitioner

## 2020-12-02 VITALS — BP 118/88 | HR 93 | Ht 68.0 in | Wt 192.5 lb

## 2020-12-02 DIAGNOSIS — Z130 Encounter for screening for diseases of the blood and blood-forming organs and certain disorders involving the immune mechanism: Secondary | ICD-10-CM

## 2020-12-02 DIAGNOSIS — E785 Hyperlipidemia, unspecified: Secondary | ICD-10-CM

## 2020-12-02 DIAGNOSIS — F17200 Nicotine dependence, unspecified, uncomplicated: Secondary | ICD-10-CM

## 2020-12-02 DIAGNOSIS — I10 Essential (primary) hypertension: Secondary | ICD-10-CM

## 2020-12-02 MED ORDER — LISINOPRIL 20 MG PO TABS: 20.0000 mg | ORAL_TABLET | Freq: Every day | ORAL | 1 refills | Status: DC

## 2020-12-02 MED ORDER — AMLODIPINE BESYLATE 10 MG PO TABS: 10.0000 mg | ORAL_TABLET | Freq: Every day | ORAL | 1 refills | Status: DC

## 2020-12-02 MED ORDER — VARENICLINE TARTRATE 1 MG PO TABS
1.0000 mg | ORAL_TABLET | Freq: Two times a day (BID) | ORAL | 0 refills | Status: DC
Start: 2021-01-02 — End: 2021-01-05

## 2020-12-02 MED ORDER — VARENICLINE TARTRATE 0.5 MG PO TABS: ORAL_TABLET | ORAL | 0 refills | Status: DC

## 2020-12-02 NOTE — Progress Notes (Signed)
Assessment & Plan:  Audrey Montgomery was seen today for hypertension.  Diagnoses and all orders for this visit:  Essential hypertension -     amLODipine (NORVASC) 10 MG tablet; Take 1 tablet (10 mg total) by mouth daily. -     lisinopril (ZESTRIL) 20 MG tablet; Take 1 tablet (20 mg total) by mouth daily. Continue all antihypertensives as prescribed.  Remember to bring in your blood pressure log with you for your follow up appointment.  DASH/Mediterranean Diets are healthier choices for HTN.    Tobacco dependence -     varenicline (CHANTIX) 0.5 MG tablet; Take one 0.5 mg tablet by mouth daily x 3 days,  increase to one 0.5 mg tablet twice daily x 4 days,  increase to one 1 mg tablet 2x daily -     varenicline (CHANTIX CONTINUING MONTH PAK) 1 MG tablet; Take 1 tablet (1 mg total) by mouth 2 (two) times daily. FILL 11-24   Patient has been counseled on age-appropriate routine health concerns for screening and prevention. These are reviewed and up-to-date. Referrals have been placed accordingly. Immunizations are up-to-date or declined.    Subjective:   Chief Complaint  Patient presents with   Hypertension   HPI Audrey Montgomery 60 y.o. female presents to office today for HTN She has a past medical history of Cataract and Hypertension.    Blood pressure is well controlled. She endorses medication adherence taking lisinopril 20 mg daily and amlodipine 10 mg daily. Denies chest pain, shortness of breath, palpitations, lightheadedness, dizziness, headaches or BLE edema.   BP Readings from Last 3 Encounters:  12/02/20 118/88  02/14/20 129/81  11/13/19 120/85    Tobacco Dependence States her brother passed in July of this year. Notes increased stress but wants to stop smoking. Has taken chantix in the past and would like to restart.    Review of Systems  Constitutional:  Negative for fever, malaise/fatigue and weight loss.  HENT: Negative.  Negative for nosebleeds.   Eyes: Negative.   Negative for blurred vision, double vision and photophobia.  Respiratory: Negative.  Negative for cough and shortness of breath.   Cardiovascular: Negative.  Negative for chest pain, palpitations and leg swelling.  Gastrointestinal: Negative.  Negative for heartburn, nausea and vomiting.  Musculoskeletal: Negative.  Negative for myalgias.  Neurological: Negative.  Negative for dizziness, focal weakness, seizures and headaches.  Psychiatric/Behavioral: Negative.  Negative for suicidal ideas.    Past Medical History:  Diagnosis Date   Cataract    Both Eyes.   Hypertension     Past Surgical History:  Procedure Laterality Date   APPENDECTOMY      Family History  Problem Relation Age of Onset   Stroke Brother    Peripheral vascular disease Other        father's side   Neuropathy Neg Hx        that she knows of     Social History Reviewed with no changes to be made today.   Outpatient Medications Prior to Visit  Medication Sig Dispense Refill   albuterol (VENTOLIN HFA) 108 (90 Base) MCG/ACT inhaler Inhale 2 puffs into the lungs every 6 (six) hours as needed for wheezing or shortness of breath. 1 each 2   amLODipine (NORVASC) 10 MG tablet Take 1 tablet (10 mg total) by mouth daily. 30 tablet 0   diclofenac sodium (VOLTAREN) 1 % GEL Apply 2 g topically 4 (four) times daily. Rub into affected area of foot 2 to 4 times  daily (Patient not taking: Reported on 12/02/2020) 100 g 2   lisinopril (ZESTRIL) 20 MG tablet Take 1 tablet (20 mg total) by mouth daily. 30 tablet 0   loperamide (IMODIUM A-D) 2 MG tablet Take 1 tablet (2 mg total) by mouth 4 (four) times daily as needed for diarrhea or loose stools. (Patient not taking: Reported on 12/02/2020) 30 tablet 0   No facility-administered medications prior to visit.    No Known Allergies     Objective:    BP 118/88   Pulse 93   Ht 5\' 8"  (1.727 m)   Wt 192 lb 8 oz (87.3 kg)   LMP  (LMP Unknown)   SpO2 97%   BMI 29.27 kg/m  Wt  Readings from Last 3 Encounters:  12/02/20 192 lb 8 oz (87.3 kg)  02/14/20 204 lb 6.4 oz (92.7 kg)  10/10/18 198 lb (89.8 kg)    Physical Exam Vitals and nursing note reviewed.  Constitutional:      Appearance: She is well-developed.  HENT:     Head: Normocephalic and atraumatic.  Cardiovascular:     Rate and Rhythm: Normal rate and regular rhythm.     Heart sounds: Normal heart sounds. No murmur heard.   No friction rub. No gallop.  Pulmonary:     Effort: Pulmonary effort is normal. No tachypnea or respiratory distress.     Breath sounds: Normal breath sounds. No decreased breath sounds, wheezing, rhonchi or rales.  Chest:     Chest wall: No tenderness.  Abdominal:     General: Bowel sounds are normal.     Palpations: Abdomen is soft.  Musculoskeletal:        General: Normal range of motion.     Cervical back: Normal range of motion.  Skin:    General: Skin is warm and dry.  Neurological:     Mental Status: She is alert and oriented to person, place, and time.     Coordination: Coordination normal.  Psychiatric:        Behavior: Behavior normal. Behavior is cooperative.        Thought Content: Thought content normal.        Judgment: Judgment normal.         Patient has been counseled extensively about nutrition and exercise as well as the importance of adherence with medications and regular follow-up. The patient was given clear instructions to go to ER or return to medical center if symptoms don't improve, worsen or new problems develop. The patient verbalized understanding.   Follow-up: Return for PAP SMEAR.   10/12/18, FNP-BC [facility] [place], [place]   12/02/2020, 1:59 PM

## 2020-12-03 LAB — LIPID PANEL
Chol/HDL Ratio: 7.4 ratio — ABNORMAL HIGH (ref 0.0–4.4)
Cholesterol, Total: 237 mg/dL — ABNORMAL HIGH (ref 100–199)
HDL: 32 mg/dL — ABNORMAL LOW (ref >39)
LDL Chol Calc (NIH): 179 mg/dL — ABNORMAL HIGH (ref 0–99)
Triglycerides: 142 mg/dL (ref 0–149)
VLDL Cholesterol Cal: 26 mg/dL (ref 5–40)

## 2020-12-03 LAB — CMP14+EGFR
ALT: 14 IU/L (ref 0–32)
AST: 12 IU/L (ref 0–40)
Albumin/Globulin Ratio: 1.9 (ref 1.2–2.2)
Albumin: 4.5 g/dL (ref 3.8–4.9)
Alkaline Phosphatase: 106 IU/L (ref 44–121)
BUN/Creatinine Ratio: 11 — ABNORMAL LOW (ref 12–28)
BUN: 11 mg/dL (ref 8–27)
Bilirubin Total: 0.3 mg/dL (ref 0.0–1.2)
CO2: 22 mmol/L (ref 20–29)
Calcium: 9.3 mg/dL (ref 8.7–10.3)
Chloride: 104 mmol/L (ref 96–106)
Creatinine, Ser: 1.04 mg/dL — ABNORMAL HIGH (ref 0.57–1.00)
Globulin, Total: 2.4 g/dL (ref 1.5–4.5)
Glucose: 90 mg/dL (ref 70–99)
Potassium: 3.9 mmol/L (ref 3.5–5.2)
Sodium: 141 mmol/L (ref 134–144)
Total Protein: 6.9 g/dL (ref 6.0–8.5)
eGFR: 62 mL/min/{1.73_m2} (ref >59)

## 2020-12-03 LAB — CBC
Hematocrit: 45.7 % (ref 34.0–46.6)
Hemoglobin: 15.5 g/dL (ref 11.1–15.9)
MCH: 33 pg (ref 26.6–33.0)
MCHC: 33.9 g/dL (ref 31.5–35.7)
MCV: 97 fL (ref 79–97)
Platelets: 233 10*3/uL (ref 150–450)
RBC: 4.69 x10E6/uL (ref 3.77–5.28)
RDW: 12.8 % (ref 11.7–15.4)
WBC: 9.7 10*3/uL (ref 3.4–10.8)

## 2020-12-04 ENCOUNTER — Other Ambulatory Visit: Payer: Self-pay | Admitting: Nurse Practitioner

## 2020-12-04 DIAGNOSIS — E785 Hyperlipidemia, unspecified: Secondary | ICD-10-CM

## 2020-12-04 MED ORDER — ATORVASTATIN CALCIUM 20 MG PO TABS: 20.0000 mg | ORAL_TABLET | Freq: Every day | ORAL | 3 refills | Status: DC

## 2020-12-17 ENCOUNTER — Other Ambulatory Visit: Payer: Self-pay | Admitting: Obstetrics and Gynecology

## 2020-12-17 DIAGNOSIS — Z1231 Encounter for screening mammogram for malignant neoplasm of breast: Secondary | ICD-10-CM

## 2020-12-23 ENCOUNTER — Emergency Department (HOSPITAL_COMMUNITY)
Admission: EM | Admit: 2020-12-23 | Discharge: 2020-12-23 | Disposition: A | Discharge status: Home / Self Care | Payer: BC Managed Care – PPO | Attending: Emergency Medicine | Admitting: Emergency Medicine

## 2020-12-23 DIAGNOSIS — Z5321 Procedure and treatment not carried out due to patient leaving prior to being seen by health care provider: Secondary | ICD-10-CM | POA: Insufficient documentation

## 2020-12-23 DIAGNOSIS — R220 Localized swelling, mass and lump, head: Secondary | ICD-10-CM | POA: Diagnosis not present

## 2020-12-23 LAB — CBC WITH DIFFERENTIAL/PLATELET
Abs Immature Granulocytes: 0.05 10*3/uL (ref 0.00–0.07)
Basophils Absolute: 0 10*3/uL (ref 0.0–0.1)
Basophils Relative: 0 %
Eosinophils Absolute: 0 10*3/uL (ref 0.0–0.5)
Eosinophils Relative: 0 %
HCT: 46 % (ref 36.0–46.0)
Hemoglobin: 15.7 g/dL — ABNORMAL HIGH (ref 12.0–15.0)
Immature Granulocytes: 0 %
Lymphocytes Relative: 18 %
Lymphs Abs: 2.9 10*3/uL (ref 0.7–4.0)
MCH: 33.1 pg (ref 26.0–34.0)
MCHC: 34.1 g/dL (ref 30.0–36.0)
MCV: 96.8 fL (ref 80.0–100.0)
Monocytes Absolute: 1.7 10*3/uL — ABNORMAL HIGH (ref 0.1–1.0)
Monocytes Relative: 10 %
Neutro Abs: 11.5 10*3/uL — ABNORMAL HIGH (ref 1.7–7.7)
Neutrophils Relative %: 72 %
Platelets: 242 10*3/uL (ref 150–400)
RBC: 4.75 MIL/uL (ref 3.87–5.11)
RDW: 13.3 % (ref 11.5–15.5)
WBC: 16.2 10*3/uL — ABNORMAL HIGH (ref 4.0–10.5)
nRBC: 0 % (ref 0.0–0.2)

## 2020-12-23 LAB — BASIC METABOLIC PANEL
Anion gap: 12 (ref 5–15)
BUN: 11 mg/dL (ref 6–20)
CO2: 22 mmol/L (ref 22–32)
Calcium: 9.4 mg/dL (ref 8.9–10.3)
Chloride: 105 mmol/L (ref 98–111)
Creatinine, Ser: 1.27 mg/dL — ABNORMAL HIGH (ref 0.44–1.00)
GFR, Estimated: 48 mL/min — ABNORMAL LOW (ref >60)
Glucose, Bld: 114 mg/dL — ABNORMAL HIGH (ref 70–99)
Potassium: 3 mmol/L — ABNORMAL LOW (ref 3.5–5.1)
Sodium: 139 mmol/L (ref 135–145)

## 2020-12-23 NOTE — ED Notes (Signed)
Patient decided to leave stated that she was going to her dentist

## 2020-12-23 NOTE — ED Provider Notes (Signed)
Emergency Medicine Provider Triage Evaluation Note  Audrey Montgomery , a 60 y.o. female  was evaluated in triage.  Pt complains of right upper lip swelling. States she went to bed normal last night and now has swelling to upper lip. States she has never had similar. Mildly painful.   Notably she has a prosthetic tooth under the area of swelling  Review of Systems  Positive: Upper lip swelling Negative: fever  Physical Exam  BP (!) 105/93   Pulse (!) 116   Temp 98.9 F (37.2 C) (Oral)   Resp 18   LMP  (LMP Unknown)   SpO2 100%  Gen:   Awake, no distress   Resp:  Normal effort  MSK:   Moves extremities without difficulty  Other:  Upper lip is swollen. There is area of fluctuance above right upper canine / incisor that is TTP.   Medical Decision Making  Medically screening exam initiated at 9:14 AM.  Appropriate orders placed.  Arlinda Barcelona was informed that the remainder of the evaluation will be completed by another provider, this initial triage assessment does not replace that evaluation, and the importance of remaining in the ED until their evaluation is complete.  Pt with gum abscess on exam. Also on lisinopril.    Solon Augusta Ages, [place] 12/23/20 [id number]    Milagros Loll, MD 12/23/20 1037

## 2020-12-23 NOTE — ED Triage Notes (Signed)
Pt. Stated, I woke this morning and my face was swollen and a little tender.  Nothing unusual I have taken or eat.

## 2020-12-23 NOTE — ED Triage Notes (Signed)
Pt. Stated, both of my legs, numbness and tingling.

## 2021-01-05 ENCOUNTER — Other Ambulatory Visit: Payer: Self-pay | Admitting: Nurse Practitioner

## 2021-01-05 DIAGNOSIS — F17200 Nicotine dependence, unspecified, uncomplicated: Secondary | ICD-10-CM

## 2021-01-06 MED ORDER — VARENICLINE TARTRATE 1 MG PO TABS: 1.0000 mg | ORAL_TABLET | Freq: Two times a day (BID) | ORAL | 0 refills | Status: AC

## 2021-01-08 ENCOUNTER — Other Ambulatory Visit: Payer: Self-pay | Admitting: Nurse Practitioner

## 2021-01-08 DIAGNOSIS — F17200 Nicotine dependence, unspecified, uncomplicated: Secondary | ICD-10-CM

## 2021-01-08 MED ORDER — VARENICLINE TARTRATE 0.5 MG PO TABS: ORAL_TABLET | ORAL | 0 refills | Status: DC

## 2021-01-09 ENCOUNTER — Encounter: Payer: Self-pay | Admitting: Nurse Practitioner

## 2021-01-14 ENCOUNTER — Ambulatory Visit: Payer: Self-pay | Admitting: *Deleted

## 2021-01-14 ENCOUNTER — Ambulatory Visit
Admission: RE | Admit: 2021-01-14 | Discharge: 2021-01-14 | Disposition: A | Discharge status: Home / Self Care | Payer: No Typology Code available for payment source | Source: Ambulatory Visit | Attending: Obstetrics and Gynecology | Admitting: Obstetrics and Gynecology

## 2021-01-14 ENCOUNTER — Other Ambulatory Visit: Payer: Self-pay

## 2021-01-14 VITALS — BP 114/80 | Wt 190.7 lb

## 2021-01-14 DIAGNOSIS — Z1211 Encounter for screening for malignant neoplasm of colon: Secondary | ICD-10-CM

## 2021-01-14 DIAGNOSIS — Z1239 Encounter for other screening for malignant neoplasm of breast: Secondary | ICD-10-CM

## 2021-01-14 DIAGNOSIS — Z1231 Encounter for screening mammogram for malignant neoplasm of breast: Secondary | ICD-10-CM

## 2021-01-14 NOTE — Progress Notes (Signed)
Ms. Audrey Montgomery is a 60 y.o. female who presents to [facility] clinic today with no complaints.    Pap Smear: Pap smear not completed today. Last Pap smear was 04/24/2015 at [facility] clinic and was normal with negative HPV. Per patient has no history of an abnormal Pap smear. Last Pap smear result is available in Epic.   Physical exam: Breasts Breasts symmetrical. No skin abnormalities bilateral breasts. No nipple retraction bilateral breasts. No nipple discharge bilateral breasts. No lymphadenopathy. No lumps palpated bilateral breasts. No complaints of pain or tenderness on exam.      Pelvic/Bimanual Patient refused Pap smear today. Explained the importance of Pap smears. Told patient Pap smears are covered by [facility] and to call when ready to schedule.   Smoking History: Patient is a current smoker. Discussed smoking cessation with patient. Referred to the [facility] Quitline and gave resources to the free smoking cessation classes at [facility].   Patient Navigation: Patient education provided. Access to services provided for patient through [facility] program.   Colorectal Cancer Screening: Per patient has had colonoscopy completed on 07/05/2015 at [facility]. FIT Test given to patient to complete.  No complaints today.    Breast and Cervical Cancer Risk Assessment: Patient does not have family history of breast cancer, known genetic mutations, or radiation treatment to the chest before age 78. Patient does not have history of cervical dysplasia, immunocompromised, or DES exposure in-utero.  Risk Assessment     Risk Scores       01/14/2021   Last edited by: Narda Rutherford, LPN   5-year risk: 1.4 %   Lifetime risk: 6.9 %            A: [facility] exam without pap smear No complaints.  P: Referred patient to the [facility] for a screening mammogram on mobile unit. Appointment scheduled Tuesday, January 14, 2021 at 1450.  Priscille Heidelberg, RN 01/14/2021 1:25 PM

## 2021-01-14 NOTE — Patient Instructions (Signed)
Explained breast self awareness with Flossie Dibble. Patient refused Pap smear today. Explained the importance of Pap smears. Told patient Pap smears are covered by [facility] and to call when ready to schedule. Let her know [facility] will cover Pap smears and HPV typing every 5 years unless has a history of abnormal Pap smears. Referred patient to the [facility] for a screening mammogram on mobile unit. Appointment scheduled Tuesday, January 14, 2021 at 1450. Patient escorted to the mobile unit following [facility] appointment for her screening mammogram. Let patient know the Breast Center will follow up with her within the next couple weeks with results of her mammogram by letter or phone. Discussed smoking cessation with patient. Referred to the [facility] Quitline and gave resources to the free smoking cessation classes at [facility]. Aelyn Stanaland verbalized understanding.  Trishna Cwik, Kathaleen Maser, RN 1:25 PM

## 2021-01-17 ENCOUNTER — Other Ambulatory Visit: Payer: Self-pay | Admitting: Obstetrics and Gynecology

## 2021-01-17 DIAGNOSIS — R928 Other abnormal and inconclusive findings on diagnostic imaging of breast: Secondary | ICD-10-CM

## 2021-01-23 ENCOUNTER — Telehealth: Payer: Self-pay

## 2021-01-23 LAB — FECAL OCCULT BLOOD, IMMUNOCHEMICAL: Fecal Occult Bld: NEGATIVE

## 2021-01-23 NOTE — Telephone Encounter (Signed)
Patient informed negative FIT test results.

## 2021-02-26 ENCOUNTER — Ambulatory Visit
Admission: RE | Admit: 2021-02-26 | Discharge: 2021-02-26 | Disposition: A | Discharge status: Home / Self Care | Payer: No Typology Code available for payment source | Source: Ambulatory Visit | Attending: Obstetrics and Gynecology | Admitting: Obstetrics and Gynecology

## 2021-02-26 ENCOUNTER — Other Ambulatory Visit: Payer: Self-pay | Admitting: Obstetrics and Gynecology

## 2021-02-26 ENCOUNTER — Ambulatory Visit
Admission: RE | Admit: 2021-02-26 | Discharge: 2021-02-26 | Disposition: A | Discharge status: Home / Self Care | Payer: Self-pay | Source: Ambulatory Visit | Attending: Obstetrics and Gynecology | Admitting: Obstetrics and Gynecology

## 2021-02-26 ENCOUNTER — Other Ambulatory Visit: Payer: Self-pay

## 2021-02-26 DIAGNOSIS — R928 Other abnormal and inconclusive findings on diagnostic imaging of breast: Secondary | ICD-10-CM

## 2021-06-27 ENCOUNTER — Other Ambulatory Visit: Payer: Self-pay | Admitting: Nurse Practitioner

## 2021-06-27 DIAGNOSIS — I10 Essential (primary) hypertension: Secondary | ICD-10-CM

## 2021-07-21 ENCOUNTER — Encounter: Payer: Self-pay | Admitting: Nurse Practitioner

## 2021-07-21 ENCOUNTER — Ambulatory Visit: Payer: Commercial Managed Care - HMO | Attending: Nurse Practitioner | Admitting: Nurse Practitioner

## 2021-07-21 VITALS — BP 113/80 | HR 98 | Temp 98.5°F | Resp 16 | Wt 208.0 lb

## 2021-07-21 DIAGNOSIS — D72829 Elevated white blood cell count, unspecified: Secondary | ICD-10-CM

## 2021-07-21 DIAGNOSIS — I10 Essential (primary) hypertension: Secondary | ICD-10-CM

## 2021-07-21 DIAGNOSIS — J410 Simple chronic bronchitis: Secondary | ICD-10-CM

## 2021-07-21 DIAGNOSIS — F17210 Nicotine dependence, cigarettes, uncomplicated: Secondary | ICD-10-CM | POA: Diagnosis not present

## 2021-07-21 DIAGNOSIS — F17200 Nicotine dependence, unspecified, uncomplicated: Secondary | ICD-10-CM

## 2021-07-21 DIAGNOSIS — Z5320 Procedure and treatment not carried out because of patient's decision for unspecified reasons: Secondary | ICD-10-CM

## 2021-07-21 DIAGNOSIS — G4739 Other sleep apnea: Secondary | ICD-10-CM

## 2021-07-21 DIAGNOSIS — E785 Hyperlipidemia, unspecified: Secondary | ICD-10-CM | POA: Diagnosis not present

## 2021-07-21 MED ORDER — LISINOPRIL 20 MG PO TABS: 20.0000 mg | ORAL_TABLET | Freq: Every day | ORAL | 1 refills | Status: DC

## 2021-07-21 MED ORDER — ATORVASTATIN CALCIUM 20 MG PO TABS: 20.0000 mg | ORAL_TABLET | Freq: Every day | ORAL | 3 refills | Status: DC

## 2021-07-21 MED ORDER — AMLODIPINE BESYLATE 10 MG PO TABS: 10.0000 mg | ORAL_TABLET | Freq: Every day | ORAL | 1 refills | Status: DC

## 2021-07-21 MED ORDER — VARENICLINE TARTRATE 0.5 MG PO TABS: ORAL_TABLET | ORAL | 0 refills | Status: DC

## 2021-07-21 MED ORDER — ALBUTEROL SULFATE HFA 108 (90 BASE) MCG/ACT IN AERS: 2.0000 | INHALATION_SPRAY | Freq: Four times a day (QID) | RESPIRATORY_TRACT | 2 refills | Status: DC | PRN

## 2021-07-21 NOTE — Progress Notes (Signed)
Assessment & Plan:  Electra was seen today for hypertension.  Diagnoses and all orders for this visit:  Essential hypertension -     CMP14+EGFR -     lisinopril (ZESTRIL) 20 MG tablet; Take 1 tablet (20 mg total) by mouth daily. -     amLODipine (NORVASC) 10 MG tablet; Take 1 tablet (10 mg total) by mouth daily.  Tobacco dependence -     varenicline (CHANTIX) 0.5 MG tablet; Take one 0.5 mg tablet by mouth daily x 3 days,  increase to one 0.5 mg tablet twice daily x 4 days,  increase to one 1 mg tablet 2x daily  Dyslipidemia, goal LDL below 100 -     Lipid panel -     atorvastatin (LIPITOR) 20 MG tablet; Take 1 tablet (20 mg total) by mouth daily.  Chronic bronchitis, simple ([place]) Symptoms currently controlled.  -     albuterol (VENTOLIN HFA) 108 (90 Base) MCG/ACT inhaler; Inhale 2 puffs into the lungs every 6 (six) hours as needed for wheezing or shortness of breath.  Leukocytosis, unspecified type -     CBC with Differential  HIV screening declined -     HIV antibody (with reflex)  Sleep apnea-like behavior -     Home sleep test    Patient has been counseled on age-appropriate routine health concerns for screening and prevention. These are reviewed and up-to-date. Referrals have been placed accordingly. Immunizations are up-to-date or declined.    Subjective:   Chief Complaint  Patient presents with   Hypertension   HPI Audrey Montgomery 61 y.o. female presents to office today for follow up to HTN.  PMH: HTN, Multiple joint OA HPL, Tobacco Dependence   Sleep Apnea: She presents for a sleep evaluation. She complains of snoring, choking, periods of not breathing, tossing and turning, decreased concentration, excessive daytime sleepiness, awakening in the middle of the night because of SOB, difficulty falling asleep once awakened, feels sleepy during the day, take naps during the day.  Symptoms began several months ago, unchanged since that time.  Previous evaluation  and treatment has included none.    Tobacco Dependence Requesting refill of chantix. States her previous insurance would not cover the continuation pack so she started smoking again. Less thank 0.5 ppd.   HTN Fairly well controlled. Taking amlodipine 10 mg daily and lisinopril 20 mg daily.  BP Readings from Last 3 Encounters:  07/21/21 113/80  01/14/21 114/80  12/23/20 (!) 105/93        Review of Systems  Constitutional:  Negative for fever, malaise/fatigue and weight loss.  HENT: Negative.  Negative for nosebleeds.   Eyes: Negative.  Negative for blurred vision, double vision and photophobia.  Respiratory: Negative.  Negative for cough and shortness of breath.   Cardiovascular: Negative.  Negative for chest pain, palpitations and leg swelling.  Gastrointestinal: Negative.  Negative for heartburn, nausea and vomiting.  Musculoskeletal: Negative.  Negative for myalgias.  Neurological: Negative.  Negative for dizziness, focal weakness, seizures and headaches.  Psychiatric/Behavioral: Negative.  Negative for suicidal ideas.     Past Medical History:  Diagnosis Date   Cataract    Both Eyes.   Hypertension     Past Surgical History:  Procedure Laterality Date   APPENDECTOMY      Family History  Problem Relation Age of Onset   Stroke Brother    Peripheral vascular disease Other        father's side   Neuropathy Neg Hx  that she knows of    Breast cancer Neg Hx     Social History Reviewed with no changes to be made today.   Outpatient Medications Prior to Visit  Medication Sig Dispense Refill   albuterol (VENTOLIN HFA) 108 (90 Base) MCG/ACT inhaler Inhale 2 puffs into the lungs every 6 (six) hours as needed for wheezing or shortness of breath. 1 each 2   amLODipine (NORVASC) 10 MG tablet Take 1 tablet by mouth once daily 30 tablet 0   atorvastatin (LIPITOR) 20 MG tablet Take 1 tablet (20 mg total) by mouth daily. 90 tablet 3   lisinopril (ZESTRIL) 20 MG tablet  Take 1 tablet by mouth once daily 30 tablet 0   varenicline (CHANTIX) 0.5 MG tablet Take one 0.5 mg tablet by mouth daily x 3 days,  increase to one 0.5 mg tablet twice daily x 4 days,  increase to one 1 mg tablet 2x daily (Patient not taking: Reported on 07/21/2021) 30 tablet 0   No facility-administered medications prior to visit.    No Known Allergies     Objective:    BP 113/80 (BP Location: Left Arm, Patient Position: Sitting, Cuff Size: Large)   Pulse 98   Temp 98.5 F (36.9 C) (Oral)   Resp 16   Wt 208 lb (94.3 kg)   LMP  (LMP Unknown)   SpO2 96%   BMI 31.63 kg/m  Wt Readings from Last 3 Encounters:  07/21/21 208 lb (94.3 kg)  01/14/21 190 lb 11.2 oz (86.5 kg)  12/02/20 192 lb 8 oz (87.3 kg)    Physical Exam Vitals and nursing note reviewed.  Constitutional:      Appearance: She is well-developed.  HENT:     Head: Normocephalic and atraumatic.  Cardiovascular:     Rate and Rhythm: Normal rate and regular rhythm.     Heart sounds: Normal heart sounds. No murmur heard.    No friction rub. No gallop.  Pulmonary:     Effort: Pulmonary effort is normal. No tachypnea or respiratory distress.     Breath sounds: Normal breath sounds. No decreased breath sounds, wheezing, rhonchi or rales.  Chest:     Chest wall: No tenderness.  Abdominal:     General: Bowel sounds are normal.     Palpations: Abdomen is soft.  Musculoskeletal:        General: Normal range of motion.     Cervical back: Normal range of motion.  Skin:    General: Skin is warm and dry.  Neurological:     Mental Status: She is alert and oriented to person, place, and time.     Coordination: Coordination normal.  Psychiatric:        Behavior: Behavior normal. Behavior is cooperative.        Thought Content: Thought content normal.        Judgment: Judgment normal.          Patient has been counseled extensively about nutrition and exercise as well as the importance of adherence with medications  and regular follow-up. The patient was given clear instructions to go to ER or return to medical center if symptoms don't improve, worsen or new problems develop. The patient verbalized understanding.   Follow-up: Return in about 6 months (around 01/20/2022).   [name], FNP-BC [facility] [place], [place]   07/21/2021, 7:11 PM

## 2021-07-21 NOTE — Progress Notes (Signed)
HTN  Medication refill  Other concerns- did not disclose  Several falls with this year due left knee weakness

## 2021-07-22 LAB — CMP14+EGFR
ALT: 11 IU/L (ref 0–32)
AST: 13 IU/L (ref 0–40)
Albumin/Globulin Ratio: 2.2 (ref 1.2–2.2)
Albumin: 4.8 g/dL (ref 3.8–4.9)
Alkaline Phosphatase: 108 IU/L (ref 44–121)
BUN/Creatinine Ratio: 12 (ref 12–28)
BUN: 14 mg/dL (ref 8–27)
Bilirubin Total: 0.3 mg/dL (ref 0.0–1.2)
CO2: 21 mmol/L (ref 20–29)
Calcium: 9.6 mg/dL (ref 8.7–10.3)
Chloride: 106 mmol/L (ref 96–106)
Creatinine, Ser: 1.19 mg/dL — ABNORMAL HIGH (ref 0.57–1.00)
Globulin, Total: 2.2 g/dL (ref 1.5–4.5)
Glucose: 84 mg/dL (ref 70–99)
Potassium: 4.3 mmol/L (ref 3.5–5.2)
Sodium: 142 mmol/L (ref 134–144)
Total Protein: 7 g/dL (ref 6.0–8.5)
eGFR: 52 mL/min/{1.73_m2} — ABNORMAL LOW (ref >59)

## 2021-07-22 LAB — CBC WITH DIFFERENTIAL/PLATELET
Basophils Absolute: 0 10*3/uL (ref 0.0–0.2)
Basos: 0 %
EOS (ABSOLUTE): 0.1 10*3/uL (ref 0.0–0.4)
Eos: 1 %
Hematocrit: 44 % (ref 34.0–46.6)
Hemoglobin: 14.8 g/dL (ref 11.1–15.9)
Immature Grans (Abs): 0 10*3/uL (ref 0.0–0.1)
Immature Granulocytes: 0 %
Lymphocytes Absolute: 2.3 10*3/uL (ref 0.7–3.1)
Lymphs: 29 %
MCH: 32.5 pg (ref 26.6–33.0)
MCHC: 33.6 g/dL (ref 31.5–35.7)
MCV: 97 fL (ref 79–97)
Monocytes Absolute: 0.8 10*3/uL (ref 0.1–0.9)
Monocytes: 10 %
Neutrophils Absolute: 4.9 10*3/uL (ref 1.4–7.0)
Neutrophils: 60 %
Platelets: 237 10*3/uL (ref 150–450)
RBC: 4.56 x10E6/uL (ref 3.77–5.28)
RDW: 14 % (ref 11.7–15.4)
WBC: 8.1 10*3/uL (ref 3.4–10.8)

## 2021-07-22 LAB — LIPID PANEL
Chol/HDL Ratio: 7.2 ratio — ABNORMAL HIGH (ref 0.0–4.4)
Cholesterol, Total: 237 mg/dL — ABNORMAL HIGH (ref 100–199)
HDL: 33 mg/dL — ABNORMAL LOW (ref >39)
LDL Chol Calc (NIH): 154 mg/dL — ABNORMAL HIGH (ref 0–99)
Triglycerides: 269 mg/dL — ABNORMAL HIGH (ref 0–149)
VLDL Cholesterol Cal: 50 mg/dL — ABNORMAL HIGH (ref 5–40)

## 2021-07-22 LAB — HIV ANTIBODY (ROUTINE TESTING W REFLEX): HIV Screen 4th Generation wRfx: NONREACTIVE

## 2021-08-14 ENCOUNTER — Other Ambulatory Visit: Payer: Self-pay | Admitting: Nurse Practitioner

## 2021-08-14 ENCOUNTER — Encounter: Payer: Self-pay | Admitting: Nurse Practitioner

## 2021-08-14 DIAGNOSIS — F17200 Nicotine dependence, unspecified, uncomplicated: Secondary | ICD-10-CM

## 2021-08-15 ENCOUNTER — Other Ambulatory Visit: Payer: Self-pay | Admitting: Nurse Practitioner

## 2021-08-15 DIAGNOSIS — F17200 Nicotine dependence, unspecified, uncomplicated: Secondary | ICD-10-CM

## 2021-08-15 MED ORDER — VARENICLINE TARTRATE 1 MG PO TABS: 1.0000 mg | ORAL_TABLET | Freq: Two times a day (BID) | ORAL | 1 refills | Status: DC

## 2021-08-25 ENCOUNTER — Other Ambulatory Visit: Payer: Self-pay | Admitting: Nurse Practitioner

## 2021-08-25 ENCOUNTER — Ambulatory Visit: Payer: Self-pay | Admitting: *Deleted

## 2021-08-25 DIAGNOSIS — F17200 Nicotine dependence, unspecified, uncomplicated: Secondary | ICD-10-CM

## 2021-08-25 NOTE — Telephone Encounter (Signed)
Message from [place] sent at 08/25/2021  9:12 AM EDT  Summary: Pharmacy needs Rx clarification   Alinda Money from Pharmacy is requesting clarification on the medication varenicline (CHANTIX) 1 MG tablet directions. Alinda Money, stated directions seem like a starter dose, but pt has been on this medication before.   Pharmacy seeking clinical advice.           Call History   Type Contact Phone/Fax User  08/25/2021 09:08 AM EDT Phone (Incoming) [id number] - [address]), [place] - [address] (Pharmacy) [phone] Candee Furbish  Call Back - [phone]   Reason for Disposition . [1] Caller has URGENT medicine question about med that PCP or specialist prescribed AND [2] triager unable to answer question  Protocols used: Medication Question Call-A-AH

## 2021-08-26 NOTE — Telephone Encounter (Signed)
Requested medication (s) are due for refill today: no  Requested medication (s) are on the active medication list: yes  Last refill:  08/15/21  Future visit scheduled: yes  Notes to clinic:  Unable to refill per protocol, last refill by provider 08/15/21 for 30 days. Routing for approval.     Requested Prescriptions  Pending Prescriptions Disp Refills   varenicline (CHANTIX) 0.5 MG tablet [Pharmacy Med Name: Varenicline Tartrate 0.5 MG Oral Tablet] 30 tablet 0    Sig: TAKE 1 TABLET BY MOUTH ONCE DAILY FOR 3 DAYS THEN 1 TABLET TWICE DAILY FOR 4 DAYS THEN 2 TABLET TWICE DAILY     Psychiatry:  Drug Dependence Therapy - varenicline Failed - 08/25/2021 12:37 PM      Failed - Manual Review: Do not refill starter pack. 1 mg tabs may be extended up to one year if the patient has quit smoking but still feels at risk for relapse.      Failed - Cr in normal range and within 180 days    Creatinine, Ser  Date Value Ref Range Status  07/21/2021 1.19 (H) 0.57 - 1.00 mg/dL Final         Passed - Completed PHQ-2 or PHQ-9 in the last 360 days      Passed - Valid encounter within last 6 months    Recent Outpatient Visits           1 month ago Essential hypertension   [facility] [place], Shea Stakes, NP   8 months ago Essential hypertension   [facility] [place], Shea Stakes, NP   1 year ago Essential hypertension   [facility] [place], Shea Stakes, NP   1 year ago Essential hypertension   [facility] [place], Cornelius Moras, RPH-CPP   1 year ago Essential hypertension   [facility] [address] [facility] [place], Marzella Schlein, [place]       Future Appointments             In 4 months Claiborne Rigg, NP [facility]

## 2021-08-27 ENCOUNTER — Ambulatory Visit
Admission: RE | Admit: 2021-08-27 | Discharge: 2021-08-27 | Disposition: A | Discharge status: Home / Self Care | Payer: No Typology Code available for payment source | Source: Ambulatory Visit | Attending: Obstetrics and Gynecology | Admitting: Obstetrics and Gynecology

## 2021-08-27 ENCOUNTER — Ambulatory Visit
Admission: RE | Admit: 2021-08-27 | Discharge: 2021-08-27 | Disposition: A | Discharge status: Home / Self Care | Payer: Commercial Managed Care - HMO | Source: Ambulatory Visit | Attending: Obstetrics and Gynecology | Admitting: Obstetrics and Gynecology

## 2021-08-27 ENCOUNTER — Other Ambulatory Visit: Payer: Self-pay | Admitting: Obstetrics and Gynecology

## 2021-08-27 DIAGNOSIS — R928 Other abnormal and inconclusive findings on diagnostic imaging of breast: Secondary | ICD-10-CM

## 2021-08-27 DIAGNOSIS — N6322 Unspecified lump in the left breast, upper inner quadrant: Secondary | ICD-10-CM

## 2021-11-05 ENCOUNTER — Ambulatory Visit: Payer: Commercial Managed Care - HMO | Attending: Nurse Practitioner | Admitting: Nurse Practitioner

## 2021-11-05 ENCOUNTER — Encounter: Payer: Self-pay | Admitting: Nurse Practitioner

## 2021-11-05 VITALS — BP 127/83 | HR 82 | Ht 70.5 in | Wt 216.8 lb

## 2021-11-05 DIAGNOSIS — J020 Streptococcal pharyngitis: Secondary | ICD-10-CM | POA: Diagnosis not present

## 2021-11-05 DIAGNOSIS — E785 Hyperlipidemia, unspecified: Secondary | ICD-10-CM

## 2021-11-05 DIAGNOSIS — I10 Essential (primary) hypertension: Secondary | ICD-10-CM | POA: Diagnosis not present

## 2021-11-05 LAB — POCT RAPID STREP A (OFFICE): Rapid Strep A Screen: POSITIVE — AB

## 2021-11-05 MED ORDER — AMLODIPINE BESYLATE 10 MG PO TABS: 10.0000 mg | ORAL_TABLET | Freq: Every day | ORAL | 1 refills | Status: DC

## 2021-11-05 MED ORDER — AMOXICILLIN 500 MG PO CAPS: 500.0000 mg | ORAL_CAPSULE | Freq: Two times a day (BID) | ORAL | 0 refills | Status: AC

## 2021-11-05 MED ORDER — LISINOPRIL 20 MG PO TABS: 20.0000 mg | ORAL_TABLET | Freq: Every day | ORAL | 1 refills | Status: DC

## 2021-11-05 MED ORDER — ATORVASTATIN CALCIUM 20 MG PO TABS: 20.0000 mg | ORAL_TABLET | Freq: Every day | ORAL | 3 refills | Status: DC

## 2021-11-05 MED ORDER — IBUPROFEN 100 MG/5ML PO SUSP: 600.0000 mg | Freq: Three times a day (TID) | ORAL | 0 refills | Status: DC | PRN

## 2021-11-05 NOTE — Progress Notes (Signed)
Assessment & Plan:  Audrey Montgomery was seen today for sore throat.  Diagnoses and all orders for this visit:  Viral pharyngitis -     ibuprofen (ADVIL) 100 MG/5ML suspension; Take 30 mLs (600 mg total) by mouth every 8 (eight) hours as needed.    Patient has been counseled on age-appropriate routine health concerns for screening and prevention. These are reviewed and up-to-date. Referrals have been placed accordingly. Immunizations are up-to-date or declined.    Subjective:   Chief Complaint  Patient presents with   Sore Throat   HPI Audrey Montgomery 61 y.o. female presents to office today with concerns of sore throat and right ear pain.   Sore throat started 3 days ago. Associated symptoms: chills at night, Swollen right sided tonsillar lymph nodes and right ear pain. She denies cough, headache, or fever.     Review of Systems  Constitutional:  Positive for chills. Negative for fever, malaise/fatigue and weight loss.  HENT:  Positive for ear pain and sore throat. Negative for nosebleeds.   Eyes: Negative.  Negative for blurred vision, double vision and photophobia.  Respiratory: Negative.  Negative for cough and shortness of breath.   Cardiovascular: Negative.  Negative for chest pain, palpitations and leg swelling.  Gastrointestinal: Negative.  Negative for heartburn, nausea and vomiting.  Musculoskeletal: Negative.  Negative for myalgias.  Neurological: Negative.  Negative for dizziness, focal weakness, seizures and headaches.  Psychiatric/Behavioral: Negative.  Negative for suicidal ideas.     Past Medical History:  Diagnosis Date   Cataract    Both Eyes.   Hypertension     Past Surgical History:  Procedure Laterality Date   APPENDECTOMY      Family History  Problem Relation Age of Onset   Stroke Brother    Peripheral vascular disease Other        father's side   Neuropathy Neg Hx        that she knows of    Breast cancer Neg Hx     Social History  Reviewed with no changes to be made today.   Outpatient Medications Prior to Visit  Medication Sig Dispense Refill   albuterol (VENTOLIN HFA) 108 (90 Base) MCG/ACT inhaler Inhale 2 puffs into the lungs every 6 (six) hours as needed for wheezing or shortness of breath. 1 each 2   amLODipine (NORVASC) 10 MG tablet Take 1 tablet (10 mg total) by mouth daily. 90 tablet 1   atorvastatin (LIPITOR) 20 MG tablet Take 1 tablet (20 mg total) by mouth daily. 90 tablet 3   lisinopril (ZESTRIL) 20 MG tablet Take 1 tablet (20 mg total) by mouth daily. 90 tablet 1   varenicline (CHANTIX) 1 MG tablet Take 1 tablet (1 mg total) by mouth 2 (two) times daily. Take one 0.5 mg tablet by mouth daily x 3 days,  increase to one 0.5 mg tablet twice daily x 4 days,  increase to one 1 mg tablet 2x daily 60 tablet 1   No facility-administered medications prior to visit.    No Known Allergies     Objective:    BP 127/83   Pulse 82   Ht 5' 10.5" (1.791 m)   Wt 216 lb 12.8 oz (98.3 kg)   LMP  (LMP Unknown)   SpO2 97%   BMI 30.67 kg/m  Wt Readings from Last 3 Encounters:  11/05/21 216 lb 12.8 oz (98.3 kg)  07/21/21 208 lb (94.3 kg)  01/14/21 190 lb 11.2 oz (86.5 kg)  Physical Exam Constitutional:      Appearance: She is well-developed. She is not ill-appearing.  HENT:     Head: Normocephalic.     Right Ear: Hearing, tympanic membrane, ear canal and external ear normal. No middle ear effusion.     Left Ear: Hearing, tympanic membrane, ear canal and external ear normal.  No middle ear effusion.     Nose: Mucosal edema present. No rhinorrhea.     Right Sinus: No maxillary sinus tenderness or frontal sinus tenderness.     Left Sinus: No maxillary sinus tenderness or frontal sinus tenderness.     Mouth/Throat:     Mouth: Mucous membranes are moist.     Pharynx: No pharyngeal swelling, oropharyngeal exudate or posterior oropharyngeal erythema.     Tonsils: No tonsillar exudate or tonsillar abscesses. 1+ on  the right. 1+ on the left.  Neck:     Thyroid: No thyromegaly.  Cardiovascular:     Rate and Rhythm: Normal rate and regular rhythm.     Heart sounds: No murmur heard.    No friction rub. No gallop.  Pulmonary:     Effort: Pulmonary effort is normal. No respiratory distress.     Breath sounds: Normal breath sounds. No wheezing or rales.  Chest:     Chest wall: No tenderness.  Abdominal:     General: Bowel sounds are normal.     Palpations: Abdomen is soft.  Musculoskeletal:        General: Normal range of motion.     Cervical back: Normal range of motion.  Lymphadenopathy:     Head:     Right side of head: Tonsillar adenopathy present.     Cervical: No cervical adenopathy.  Skin:    General: Skin is warm and dry.  Neurological:     Mental Status: She is alert and oriented to person, place, and time.  Psychiatric:        Behavior: Behavior normal.        Thought Content: Thought content normal.        Judgment: Judgment normal.          Patient has been counseled extensively about nutrition and exercise as well as the importance of adherence with medications and regular follow-up. The patient was given clear instructions to go to ER or return to medical center if symptoms don't improve, worsen or new problems develop. The patient verbalized understanding.   Follow-up: Return if symptoms worsen or fail to improve.   [name], FNP-BC [facility] and [place], [place]   11/05/2021, 2:45 PM

## 2022-01-07 ENCOUNTER — Other Ambulatory Visit: Payer: Self-pay | Admitting: Nurse Practitioner

## 2022-01-07 DIAGNOSIS — I10 Essential (primary) hypertension: Secondary | ICD-10-CM

## 2022-01-20 ENCOUNTER — Other Ambulatory Visit: Payer: Self-pay | Admitting: Nurse Practitioner

## 2022-01-20 DIAGNOSIS — N6322 Unspecified lump in the left breast, upper inner quadrant: Secondary | ICD-10-CM

## 2022-01-20 DIAGNOSIS — I10 Essential (primary) hypertension: Secondary | ICD-10-CM

## 2022-01-21 ENCOUNTER — Ambulatory Visit: Payer: Commercial Managed Care - HMO | Attending: Nurse Practitioner | Admitting: Nurse Practitioner

## 2022-01-21 ENCOUNTER — Encounter: Payer: Self-pay | Admitting: Nurse Practitioner

## 2022-01-21 VITALS — BP 118/81 | HR 74 | Ht 70.5 in | Wt 213.6 lb

## 2022-01-21 DIAGNOSIS — I10 Essential (primary) hypertension: Secondary | ICD-10-CM

## 2022-01-21 DIAGNOSIS — Z1211 Encounter for screening for malignant neoplasm of colon: Secondary | ICD-10-CM | POA: Diagnosis not present

## 2022-01-21 MED ORDER — LISINOPRIL 20 MG PO TABS: 20.0000 mg | ORAL_TABLET | Freq: Every day | ORAL | 1 refills | Status: DC

## 2022-01-21 NOTE — Progress Notes (Signed)
No concerns. 

## 2022-01-21 NOTE — Progress Notes (Signed)
Assessment & Plan:  Audrey Montgomery was seen today for hypertension.  Diagnoses and all orders for this visit:  Essential hypertension -     lisinopril (ZESTRIL) 20 MG tablet; Take 1 tablet (20 mg total) by mouth daily. -     CMP14+EGFR Continue all antihypertensives as prescribed.  Reminded to bring in blood pressure log for follow  up appointment.  RECOMMENDATIONS: DASH/Mediterranean Diets are healthier choices for HTN.    Colon cancer screening -     Cologuard    Patient has been counseled on age-appropriate routine health concerns for screening and prevention. These are reviewed and up-to-date. Referrals have been placed accordingly. Immunizations are up-to-date or declined.    Subjective:   Chief Complaint  Patient presents with   Hypertension   Hypertension Pertinent negatives include no blurred vision, chest pain, headaches, malaise/fatigue, palpitations or shortness of breath.   Audrey Montgomery 61 y.o. female presents to office today for follow-up to hypertension.  She has mammogram scheduled next month as well as Pap smear.   Patient has been counseled on age-appropriate routine health concerns for screening and prevention. These are reviewed and up-to-date. Referrals have been placed accordingly. Immunizations are up-to-date or declined.      Blood pressure is well-controlled with lisinopril 20 mg daily and amlodipine 10 mg daily. BP Readings from Last 3 Encounters:  01/21/22 118/81  11/05/21 127/83  07/21/21 113/80     Review of Systems  Constitutional:  Negative for fever, malaise/fatigue and weight loss.  HENT: Negative.  Negative for nosebleeds.   Eyes: Negative.  Negative for blurred vision, double vision and photophobia.  Respiratory: Negative.  Negative for cough and shortness of breath.   Cardiovascular: Negative.  Negative for chest pain, palpitations and leg swelling.  Gastrointestinal: Negative.  Negative for heartburn, nausea and vomiting.   Musculoskeletal: Negative.  Negative for myalgias.  Neurological: Negative.  Negative for dizziness, focal weakness, seizures and headaches.  Psychiatric/Behavioral: Negative.  Negative for suicidal ideas.     Past Medical History:  Diagnosis Date   Cataract    Both Eyes.   Hypertension     Past Surgical History:  Procedure Laterality Date   APPENDECTOMY      Family History  Problem Relation Age of Onset   Stroke Brother    Peripheral vascular disease Other        father's side   Neuropathy Neg Hx        that she knows of    Breast cancer Neg Hx     Social History Reviewed with no changes to be made today.   Outpatient Medications Prior to Visit  Medication Sig Dispense Refill   albuterol (VENTOLIN HFA) 108 (90 Base) MCG/ACT inhaler Inhale 2 puffs into the lungs every 6 (six) hours as needed for wheezing or shortness of breath. 1 each 2   amLODipine (NORVASC) 10 MG tablet Take 1 tablet (10 mg total) by mouth daily. 90 tablet 1   atorvastatin (LIPITOR) 20 MG tablet Take 1 tablet (20 mg total) by mouth daily. 90 tablet 3   ibuprofen (ADVIL) 100 MG/5ML suspension Take 30 mLs (600 mg total) by mouth every 8 (eight) hours as needed. 273 mL 0   lisinopril (ZESTRIL) 20 MG tablet Take 1 tablet by mouth once daily 90 tablet 0   varenicline (CHANTIX) 1 MG tablet Take 1 tablet (1 mg total) by mouth 2 (two) times daily. Take one 0.5 mg tablet by mouth daily x 3 days,  increase  to one 0.5 mg tablet twice daily x 4 days,  increase to one 1 mg tablet 2x daily 60 tablet 1   No facility-administered medications prior to visit.    No Known Allergies     Objective:    BP 118/81   Pulse 74   Ht 5' 10.5" (1.791 m)   Wt 213 lb 9.6 oz (96.9 kg)   LMP  (LMP Unknown)   SpO2 96%   BMI 30.22 kg/m  Wt Readings from Last 3 Encounters:  01/21/22 213 lb 9.6 oz (96.9 kg)  11/05/21 216 lb 12.8 oz (98.3 kg)  07/21/21 208 lb (94.3 kg)    Physical Exam Vitals and nursing note reviewed.   Constitutional:      Appearance: She is well-developed.  HENT:     Head: Normocephalic and atraumatic.  Cardiovascular:     Rate and Rhythm: Normal rate and regular rhythm.     Heart sounds: Normal heart sounds. No murmur heard.    No friction rub. No gallop.  Pulmonary:     Effort: Pulmonary effort is normal. No tachypnea or respiratory distress.     Breath sounds: Normal breath sounds. No decreased breath sounds, wheezing, rhonchi or rales.  Chest:     Chest wall: No tenderness.  Abdominal:     General: Bowel sounds are normal.     Palpations: Abdomen is soft.  Musculoskeletal:        General: Normal range of motion.     Cervical back: Normal range of motion.  Skin:    General: Skin is warm and dry.  Neurological:     Mental Status: She is alert and oriented to person, place, and time.     Coordination: Coordination normal.  Psychiatric:        Behavior: Behavior normal. Behavior is cooperative.        Thought Content: Thought content normal.        Judgment: Judgment normal.          Patient has been counseled extensively about nutrition and exercise as well as the importance of adherence with medications and regular follow-up. The patient was given clear instructions to go to ER or return to medical center if symptoms don't improve, worsen or new problems develop. The patient verbalized understanding.   Follow-up: Return in about 3 months (around 04/22/2022) for HTN.   [name], FNP-BC [facility] and [facility] [place], [place]   01/21/2022, 8:50 AM

## 2022-01-22 LAB — CMP14+EGFR
ALT: 17 IU/L (ref 0–32)
AST: 17 IU/L (ref 0–40)
Albumin/Globulin Ratio: 2.1 (ref 1.2–2.2)
Albumin: 4.7 g/dL (ref 3.9–4.9)
Alkaline Phosphatase: 110 IU/L (ref 44–121)
BUN/Creatinine Ratio: 8 — ABNORMAL LOW (ref 12–28)
BUN: 10 mg/dL (ref 8–27)
Bilirubin Total: 0.4 mg/dL (ref 0.0–1.2)
CO2: 21 mmol/L (ref 20–29)
Calcium: 9.6 mg/dL (ref 8.7–10.3)
Chloride: 106 mmol/L (ref 96–106)
Creatinine, Ser: 1.19 mg/dL — ABNORMAL HIGH (ref 0.57–1.00)
Globulin, Total: 2.2 g/dL (ref 1.5–4.5)
Glucose: 106 mg/dL — ABNORMAL HIGH (ref 70–99)
Potassium: 4.3 mmol/L (ref 3.5–5.2)
Sodium: 143 mmol/L (ref 134–144)
Total Protein: 6.9 g/dL (ref 6.0–8.5)
eGFR: 52 mL/min/{1.73_m2} — ABNORMAL LOW (ref >59)

## 2022-02-12 LAB — COLOGUARD: COLOGUARD: POSITIVE — AB

## 2022-02-13 ENCOUNTER — Other Ambulatory Visit: Payer: Self-pay | Admitting: Nurse Practitioner

## 2022-02-13 DIAGNOSIS — R195 Other fecal abnormalities: Secondary | ICD-10-CM

## 2022-02-26 DIAGNOSIS — H25812 Combined forms of age-related cataract, left eye: Secondary | ICD-10-CM | POA: Diagnosis not present

## 2022-03-02 ENCOUNTER — Ambulatory Visit
Admission: RE | Admit: 2022-03-02 | Discharge: 2022-03-02 | Disposition: A | Discharge status: Home / Self Care | Payer: No Typology Code available for payment source | Source: Ambulatory Visit | Attending: Obstetrics and Gynecology | Admitting: Obstetrics and Gynecology

## 2022-03-02 ENCOUNTER — Ambulatory Visit
Admission: RE | Admit: 2022-03-02 | Discharge: 2022-03-02 | Disposition: A | Discharge status: Home / Self Care | Payer: Medicaid Other | Source: Ambulatory Visit | Attending: Obstetrics and Gynecology | Admitting: Obstetrics and Gynecology

## 2022-03-02 DIAGNOSIS — N6322 Unspecified lump in the left breast, upper inner quadrant: Secondary | ICD-10-CM

## 2022-03-02 DIAGNOSIS — R922 Inconclusive mammogram: Secondary | ICD-10-CM | POA: Diagnosis not present

## 2022-03-13 DIAGNOSIS — H269 Unspecified cataract: Secondary | ICD-10-CM | POA: Diagnosis not present

## 2022-03-13 DIAGNOSIS — H25812 Combined forms of age-related cataract, left eye: Secondary | ICD-10-CM | POA: Diagnosis not present

## 2022-03-27 DIAGNOSIS — H25811 Combined forms of age-related cataract, right eye: Secondary | ICD-10-CM | POA: Diagnosis not present

## 2022-03-27 DIAGNOSIS — H269 Unspecified cataract: Secondary | ICD-10-CM | POA: Diagnosis not present

## 2022-04-22 ENCOUNTER — Ambulatory Visit: Payer: Medicaid Other | Attending: Nurse Practitioner | Admitting: Nurse Practitioner

## 2022-04-22 ENCOUNTER — Encounter: Payer: Self-pay | Admitting: Nurse Practitioner

## 2022-04-22 VITALS — BP 118/76 | HR 72 | Ht 70.5 in | Wt 207.0 lb

## 2022-04-22 DIAGNOSIS — J301 Allergic rhinitis due to pollen: Secondary | ICD-10-CM | POA: Diagnosis not present

## 2022-04-22 DIAGNOSIS — E785 Hyperlipidemia, unspecified: Secondary | ICD-10-CM | POA: Diagnosis not present

## 2022-04-22 DIAGNOSIS — J410 Simple chronic bronchitis: Secondary | ICD-10-CM

## 2022-04-22 DIAGNOSIS — F17200 Nicotine dependence, unspecified, uncomplicated: Secondary | ICD-10-CM

## 2022-04-22 DIAGNOSIS — I10 Essential (primary) hypertension: Secondary | ICD-10-CM

## 2022-04-22 MED ORDER — AMLODIPINE BESYLATE 10 MG PO TABS: 10.0000 mg | ORAL_TABLET | Freq: Every day | ORAL | 1 refills | Status: DC

## 2022-04-22 MED ORDER — LORATADINE 10 MG PO TABS: 10.0000 mg | ORAL_TABLET | Freq: Every day | ORAL | 3 refills | Status: DC

## 2022-04-22 MED ORDER — LISINOPRIL 20 MG PO TABS: 20.0000 mg | ORAL_TABLET | Freq: Every day | ORAL | 1 refills | Status: DC

## 2022-04-22 MED ORDER — ALBUTEROL SULFATE HFA 108 (90 BASE) MCG/ACT IN AERS: 2.0000 | INHALATION_SPRAY | Freq: Four times a day (QID) | RESPIRATORY_TRACT | 2 refills | Status: DC | PRN

## 2022-04-22 MED ORDER — FLUTICASONE PROPIONATE 50 MCG/ACT NA SUSP: 2.0000 | Freq: Every day | NASAL | 6 refills | Status: DC

## 2022-04-22 NOTE — Progress Notes (Signed)
Assessment & Plan:  Audrey Montgomery was seen today for hypertension.  Diagnoses and all orders for this visit:  Essential hypertension -     amLODipine (NORVASC) 10 MG tablet; Take 1 tablet (10 mg total) by mouth daily. -     lisinopril (ZESTRIL) 20 MG tablet; Take 1 tablet (20 mg total) by mouth daily. -     CMP14+EGFR Continue all antihypertensives as prescribed.  Reminded to bring in blood pressure log for follow  up appointment.  RECOMMENDATIONS: DASH/Mediterranean Diets are healthier choices for HTN.    Chronic bronchitis, simple (HCC) Symptoms well controlled -     albuterol (VENTOLIN HFA) 108 (90 Base) MCG/ACT inhaler; Inhale 2 puffs into the lungs every 6 (six) hours as needed for wheezing or shortness of breath.  Seasonal allergic rhinitis due to pollen -     loratadine (CLARITIN) 10 MG tablet; Take 1 tablet (10 mg total) by mouth daily. -     fluticasone (FLONASE) 50 MCG/ACT nasal spray; Place 2 sprays into both nostrils daily.  Dyslipidemia, goal LDL below 100 -     Lipid panel  Tobacco dependence -     CT CHEST LUNG CA SCREEN LOW DOSE W/O CM; Future    Patient has been counseled on age-appropriate routine health concerns for screening and prevention. These are reviewed and up-to-date. Referrals have been placed accordingly. Immunizations are up-to-date or declined.    Subjective:   Chief Complaint  Patient presents with   Hypertension   HPI Audrey Montgomery 62 y.o. female presents to office today for follow up to HTN   Had recent cataract surgery on both eyes last month.  Doing well today however reports currently dealing with seasonal allergies including symptoms of red itchy eyes, rhinorrhea, dry cough and scratchy throat.  Taking over-the-counter Claritin at this time which does not seem to be very effective.   HTN Blood pressure is well-controlled today with amlodipine 10 mg daily and lisinopril 20 mg daily.  She is a current smoker and we have ordered CT  lung cancer screening test today. BP Readings from Last 3 Encounters:  04/22/22 118/76  01/21/22 118/81  11/05/21 127/83      Review of Systems  Constitutional:  Negative for fever, malaise/fatigue and weight loss.  HENT: Negative.  Negative for nosebleeds.        SEE HPI  Eyes:  Positive for redness. Negative for blurred vision, double vision, photophobia, pain and discharge.  Respiratory:  Positive for cough. Negative for shortness of breath.   Cardiovascular: Negative.  Negative for chest pain, palpitations and leg swelling.  Gastrointestinal: Negative.  Negative for heartburn, nausea and vomiting.  Musculoskeletal: Negative.  Negative for myalgias.  Neurological: Negative.  Negative for dizziness, focal weakness, seizures and headaches.  Endo/Heme/Allergies:  Positive for environmental allergies.  Psychiatric/Behavioral: Negative.  Negative for suicidal ideas.     Past Medical History:  Diagnosis Date   Cataract    Both Eyes.   Hypertension     Past Surgical History:  Procedure Laterality Date   APPENDECTOMY      Family History  Problem Relation Age of Onset   Stroke Brother    Peripheral vascular disease Other        father's side   Neuropathy Neg Hx        that she knows of    Breast cancer Neg Hx     Social History Reviewed with no changes to be made today.   Outpatient Medications Prior  to Visit  Medication Sig Dispense Refill   atorvastatin (LIPITOR) 20 MG tablet Take 1 tablet (20 mg total) by mouth daily. 90 tablet 3   albuterol (VENTOLIN HFA) 108 (90 Base) MCG/ACT inhaler Inhale 2 puffs into the lungs every 6 (six) hours as needed for wheezing or shortness of breath. 1 each 2   amLODipine (NORVASC) 10 MG tablet Take 1 tablet (10 mg total) by mouth daily. 90 tablet 1   ibuprofen (ADVIL) 100 MG/5ML suspension Take 30 mLs (600 mg total) by mouth every 8 (eight) hours as needed. 273 mL 0   lisinopril (ZESTRIL) 20 MG tablet Take 1 tablet (20 mg total) by  mouth daily. 90 tablet 1   No facility-administered medications prior to visit.    No Known Allergies     Objective:    BP 118/76   Pulse 72   Ht 5' 10.5" (1.791 m)   Wt 207 lb (93.9 kg)   LMP  (LMP Unknown)   SpO2 97%   BMI 29.28 kg/m  Wt Readings from Last 3 Encounters:  04/22/22 207 lb (93.9 kg)  01/21/22 213 lb 9.6 oz (96.9 kg)  11/05/21 216 lb 12.8 oz (98.3 kg)    Physical Exam Vitals and nursing note reviewed.  Constitutional:      Appearance: She is well-developed.  HENT:     Head: Normocephalic and atraumatic.  Cardiovascular:     Rate and Rhythm: Normal rate and regular rhythm.     Heart sounds: Normal heart sounds. No murmur heard.    No friction rub. No gallop.  Pulmonary:     Effort: Pulmonary effort is normal. No tachypnea or respiratory distress.     Breath sounds: Normal breath sounds. No decreased breath sounds, wheezing, rhonchi or rales.  Chest:     Chest wall: No tenderness.  Abdominal:     General: Bowel sounds are normal.     Palpations: Abdomen is soft.  Musculoskeletal:        General: Normal range of motion.     Cervical back: Normal range of motion.  Skin:    General: Skin is warm and dry.  Neurological:     Mental Status: She is alert and oriented to person, place, and time.     Coordination: Coordination normal.  Psychiatric:        Behavior: Behavior normal. Behavior is cooperative.        Thought Content: Thought content normal.        Judgment: Judgment normal.          Patient has been counseled extensively about nutrition and exercise as well as the importance of adherence with medications and regular follow-up. The patient was given clear instructions to go to ER or return to medical center if symptoms don't improve, worsen or new problems develop. The patient verbalized understanding.   Follow-up: Return in about 4 months (around 08/22/2022) for PAP SMEAR.   [name], FNP-BC [facility] and  [place], [place]   04/22/2022, 12:08 PM

## 2022-04-22 NOTE — Patient Instructions (Signed)
[facility] [address] [place], [place] [address] ?PH# [phone] ?

## 2022-04-22 NOTE — Progress Notes (Signed)
Something for allergies and information for GI.   Placed in [place] [address] [place], [place] [address] PH# [phone]

## 2022-04-23 LAB — CMP14+EGFR
ALT: 16 IU/L (ref 0–32)
AST: 17 IU/L (ref 0–40)
Albumin/Globulin Ratio: 2 (ref 1.2–2.2)
Albumin: 4.3 g/dL (ref 3.9–4.9)
Alkaline Phosphatase: 104 IU/L (ref 44–121)
BUN/Creatinine Ratio: 11 — ABNORMAL LOW (ref 12–28)
BUN: 12 mg/dL (ref 8–27)
Bilirubin Total: 0.4 mg/dL (ref 0.0–1.2)
CO2: 23 mmol/L (ref 20–29)
Calcium: 10 mg/dL (ref 8.7–10.3)
Chloride: 110 mmol/L — ABNORMAL HIGH (ref 96–106)
Creatinine, Ser: 1.1 mg/dL — ABNORMAL HIGH (ref 0.57–1.00)
Globulin, Total: 2.1 g/dL (ref 1.5–4.5)
Glucose: 75 mg/dL (ref 70–99)
Potassium: 4.6 mmol/L (ref 3.5–5.2)
Sodium: 148 mmol/L — ABNORMAL HIGH (ref 134–144)
Total Protein: 6.4 g/dL (ref 6.0–8.5)
eGFR: 57 mL/min/{1.73_m2} — ABNORMAL LOW (ref >59)

## 2022-04-23 LAB — LIPID PANEL
Chol/HDL Ratio: 4.4 ratio (ref 0.0–4.4)
Cholesterol, Total: 131 mg/dL (ref 100–199)
HDL: 30 mg/dL — ABNORMAL LOW (ref >39)
LDL Chol Calc (NIH): 76 mg/dL (ref 0–99)
Triglycerides: 144 mg/dL (ref 0–149)
VLDL Cholesterol Cal: 25 mg/dL (ref 5–40)

## 2022-05-21 ENCOUNTER — Ambulatory Visit
Admission: RE | Admit: 2022-05-21 | Discharge: 2022-05-21 | Disposition: A | Discharge status: Home / Self Care | Payer: Medicaid Other | Source: Ambulatory Visit | Attending: Nurse Practitioner | Admitting: Nurse Practitioner

## 2022-05-21 DIAGNOSIS — F17200 Nicotine dependence, unspecified, uncomplicated: Secondary | ICD-10-CM

## 2022-05-25 ENCOUNTER — Other Ambulatory Visit: Payer: Self-pay | Admitting: Nurse Practitioner

## 2022-05-25 ENCOUNTER — Encounter: Payer: Self-pay | Admitting: Nurse Practitioner

## 2022-05-25 MED ORDER — VARENICLINE TARTRATE (STARTER) 0.5 MG X 11 & 1 MG X 42 PO TBPK: ORAL_TABLET | ORAL | 0 refills | Status: DC

## 2022-05-26 ENCOUNTER — Other Ambulatory Visit: Payer: Self-pay | Admitting: Nurse Practitioner

## 2022-05-26 DIAGNOSIS — K769 Liver disease, unspecified: Secondary | ICD-10-CM

## 2022-06-15 ENCOUNTER — Other Ambulatory Visit: Payer: Self-pay | Admitting: Nurse Practitioner

## 2022-06-15 MED ORDER — VARENICLINE TARTRATE 1 MG PO TABS: 1.0000 mg | ORAL_TABLET | Freq: Two times a day (BID) | ORAL | 0 refills | Status: DC

## 2022-07-06 ENCOUNTER — Other Ambulatory Visit: Payer: Self-pay | Admitting: Nurse Practitioner

## 2022-07-06 DIAGNOSIS — J410 Simple chronic bronchitis: Secondary | ICD-10-CM

## 2022-07-10 ENCOUNTER — Encounter: Payer: Self-pay | Admitting: Nurse Practitioner

## 2022-07-13 ENCOUNTER — Ambulatory Visit
Admission: RE | Admit: 2022-07-13 | Discharge: 2022-07-13 | Disposition: A | Discharge status: Home / Self Care | Payer: Medicaid Other | Source: Ambulatory Visit | Attending: Nurse Practitioner | Admitting: Nurse Practitioner

## 2022-07-13 DIAGNOSIS — K769 Liver disease, unspecified: Secondary | ICD-10-CM

## 2022-07-13 MED ORDER — GADOPICLENOL 0.5 MMOL/ML IV SOLN
10.0000 mL | Freq: Once | INTRAVENOUS | Status: AC | PRN
  Administered 2022-07-13: 10 mL via INTRAVENOUS

## 2022-08-07 ENCOUNTER — Other Ambulatory Visit: Payer: Self-pay | Admitting: Family Medicine

## 2022-08-07 DIAGNOSIS — J410 Simple chronic bronchitis: Secondary | ICD-10-CM

## 2022-08-24 ENCOUNTER — Other Ambulatory Visit (HOSPITAL_COMMUNITY)
Admission: RE | Admit: 2022-08-24 | Discharge: 2022-08-24 | Disposition: A | Discharge status: Home / Self Care | Payer: Medicaid Other | Source: Ambulatory Visit | Attending: Nurse Practitioner | Admitting: Nurse Practitioner

## 2022-08-24 ENCOUNTER — Ambulatory Visit: Payer: Medicaid Other | Attending: Nurse Practitioner | Admitting: Nurse Practitioner

## 2022-08-24 ENCOUNTER — Encounter: Payer: Self-pay | Admitting: Nurse Practitioner

## 2022-08-24 VITALS — BP 119/82 | HR 86 | Ht 70.5 in | Wt 214.2 lb

## 2022-08-24 DIAGNOSIS — Z1151 Encounter for screening for human papillomavirus (HPV): Secondary | ICD-10-CM | POA: Diagnosis not present

## 2022-08-24 DIAGNOSIS — Z780 Asymptomatic menopausal state: Secondary | ICD-10-CM | POA: Insufficient documentation

## 2022-08-24 DIAGNOSIS — Z124 Encounter for screening for malignant neoplasm of cervix: Secondary | ICD-10-CM | POA: Diagnosis not present

## 2022-08-24 DIAGNOSIS — N841 Polyp of cervix uteri: Secondary | ICD-10-CM | POA: Diagnosis not present

## 2022-08-24 DIAGNOSIS — Z01419 Encounter for gynecological examination (general) (routine) without abnormal findings: Secondary | ICD-10-CM | POA: Insufficient documentation

## 2022-08-24 DIAGNOSIS — I10 Essential (primary) hypertension: Secondary | ICD-10-CM | POA: Insufficient documentation

## 2022-08-24 DIAGNOSIS — K5909 Other constipation: Secondary | ICD-10-CM | POA: Diagnosis not present

## 2022-08-24 DIAGNOSIS — D72829 Elevated white blood cell count, unspecified: Secondary | ICD-10-CM | POA: Insufficient documentation

## 2022-08-24 MED ORDER — POLYETHYLENE GLYCOL 3350 17 G PO PACK
17.0000 g | PACK | Freq: Every day | ORAL | 3 refills | Status: DC
Start: 2022-08-24 — End: 2022-11-30

## 2022-08-24 NOTE — Progress Notes (Signed)
Assessment & Plan:  Audrey Montgomery was seen today for gynecologic exam.  Diagnoses and all orders for this visit:  Encounter for Papanicolaou smear of cervix -     Cervicovaginal ancillary only -     Cytology - PAP  Cervical polyp -     Ambulatory referral to Gynecology  Leukocytosis, unspecified type -     CBC with Differential  Primary hypertension -     CMP14+EGFR  Chronic constipation -     polyethylene glycol (MIRALAX) 17 g packet; Take 17 g by mouth daily. Linzess not covered by insurance. If no improvement will need to see GI.    Patient has been counseled on age-appropriate routine health concerns for screening and prevention. These are reviewed and up-to-date. Referrals have been placed accordingly. Immunizations are up-to-date or declined.    Subjective:   Chief Complaint  Patient presents with   Gynecologic Exam    Audrey Montgomery 62 y.o. female presents to office today for PAP smear.   Constipation She endorses chronic constipation. Only having a few BM per month. She has tried prune juice and other OTC stool softeners with no relief. She denies melena or hematochezia. Drinks at least 64-80oz of water per day.     Review of Systems  Constitutional: Negative.  Negative for chills, fever, malaise/fatigue and weight loss.  Respiratory: Negative.  Negative for cough, shortness of breath and wheezing.   Cardiovascular: Negative.  Negative for chest pain, orthopnea and leg swelling.  Gastrointestinal:  Positive for constipation. Negative for abdominal pain, blood in stool, diarrhea, heartburn, melena, nausea and vomiting.  Genitourinary: Negative.  Negative for flank pain.  Skin: Negative.  Negative for rash.  Psychiatric/Behavioral:  Negative for suicidal ideas.     Past Medical History:  Diagnosis Date   Cataract    Both Eyes.   Hypertension     Past Surgical History:  Procedure Laterality Date   APPENDECTOMY      Family History  Problem  Relation Age of Onset   Stroke Brother    Peripheral vascular disease Other        father's side   Neuropathy Neg Hx        that she knows of    Breast cancer Neg Hx     Social History Reviewed with no changes to be made today.   Outpatient Medications Prior to Visit  Medication Sig Dispense Refill   amLODipine (NORVASC) 10 MG tablet Take 1 tablet (10 mg total) by mouth daily. 90 tablet 1   atorvastatin (LIPITOR) 20 MG tablet Take 1 tablet (20 mg total) by mouth daily. 90 tablet 3   fluticasone (FLONASE) 50 MCG/ACT nasal spray Place 2 sprays into both nostrils daily. 16 g 6   lisinopril (ZESTRIL) 20 MG tablet Take 1 tablet (20 mg total) by mouth daily. 90 tablet 1   loratadine (CLARITIN) 10 MG tablet Take 1 tablet (10 mg total) by mouth daily. 90 tablet 3   varenicline (CHANTIX CONTINUING MONTH PAK) 1 MG tablet Take 1 tablet (1 mg total) by mouth 2 (two) times daily. 180 tablet 0   Varenicline Tartrate, Starter, (CHANTIX STARTING MONTH PAK) 0.5 MG X 11 & 1 MG X 42 TBPK Take one 0.5 mg tablet by mouth daily x 3 days, increase to one 0.5 mg tablet twice daily x 4 days, increase to one 1 mg tablet 2x daily 53 each 0   VENTOLIN HFA 108 (90 Base) MCG/ACT inhaler INHALE 2 PUFFS BY MOUTH  EVERY 6 HOURS AS NEEDED FOR WHEEZING FOR SHORTNESS OF BREATH 18 g 0   No facility-administered medications prior to visit.    No Known Allergies     Objective:    BP 119/82 (BP Location: Left Arm, Patient Position: Sitting, Cuff Size: Large)   Pulse 86   Ht 5' 10.5" (1.791 m)   Wt 214 lb 3.2 oz (97.2 kg)   LMP  (LMP Unknown)   SpO2 98%   BMI 30.30 kg/m  Wt Readings from Last 3 Encounters:  08/24/22 214 lb 3.2 oz (97.2 kg)  04/22/22 207 lb (93.9 kg)  01/21/22 213 lb 9.6 oz (96.9 kg)    Physical Exam Exam conducted with a chaperone present.  Constitutional:      Appearance: She is well-developed.  HENT:     Head: Normocephalic and atraumatic.     Right Ear: Hearing, tympanic membrane, ear  canal and external ear normal.     Left Ear: Hearing, tympanic membrane, ear canal and external ear normal.     Nose: Nose normal.     Right Turbinates: Not enlarged.     Left Turbinates: Not enlarged.     Mouth/Throat:     Lips: Pink.     Mouth: Mucous membranes are moist.     Dentition: No dental tenderness, gingival swelling, dental abscesses or gum lesions.     Pharynx: No oropharyngeal exudate.  Eyes:     General: No scleral icterus.       Right eye: No discharge.     Extraocular Movements: Extraocular movements intact.     Conjunctiva/sclera: Conjunctivae normal.     Pupils: Pupils are equal, round, and reactive to light.  Neck:     Thyroid: No thyromegaly.     Trachea: No tracheal deviation.  Cardiovascular:     Rate and Rhythm: Normal rate and regular rhythm.     Heart sounds: Normal heart sounds. No murmur heard.    No friction rub.  Pulmonary:     Effort: Pulmonary effort is normal. No accessory muscle usage or respiratory distress.     Breath sounds: Normal breath sounds. No decreased breath sounds, wheezing, rhonchi or rales.  Abdominal:     General: Bowel sounds are normal. There is no distension.     Palpations: Abdomen is soft. There is no mass.     Tenderness: There is no abdominal tenderness. There is no right CVA tenderness, left CVA tenderness, guarding or rebound.     Hernia: No hernia is present.  Genitourinary:    Exam position: Lithotomy position.     Cervix: Lesion (cervical polyp) present.     Musculoskeletal:        General: No tenderness or deformity. Normal range of motion.     Cervical back: Normal range of motion and neck supple.  Lymphadenopathy:     Cervical: No cervical adenopathy.  Skin:    General: Skin is warm and dry.     Findings: No erythema.  Neurological:     Mental Status: She is alert and oriented to person, place, and time.     Cranial Nerves: No cranial nerve deficit.     Motor: Motor function is intact.     Coordination:  Coordination is intact. Coordination normal.     Gait: Gait is intact.     Deep Tendon Reflexes:     Reflex Scores:      Patellar reflexes are 1+ on the right side and 1+ on the left side.  Psychiatric:        Attention and Perception: Attention normal.        Mood and Affect: Mood normal.        Speech: Speech normal.        Behavior: Behavior normal.        Thought Content: Thought content normal.        Judgment: Judgment normal.          Patient has been counseled extensively about nutrition and exercise as well as the importance of adherence with medications and regular follow-up. The patient was given clear instructions to go to ER or return to medical center if symptoms don't improve, worsen or new problems develop. The patient verbalized understanding.   Follow-up: Return in about 3 months (around 11/24/2022).   Claiborne Rigg, FNP-BC [facility] and [facility] [place], [place] [phone]   08/24/2022, 11:36 AM

## 2022-08-25 ENCOUNTER — Other Ambulatory Visit: Payer: Self-pay | Admitting: Nurse Practitioner

## 2022-08-25 DIAGNOSIS — R7989 Other specified abnormal findings of blood chemistry: Secondary | ICD-10-CM

## 2022-08-25 DIAGNOSIS — N760 Acute vaginitis: Secondary | ICD-10-CM

## 2022-08-25 LAB — CMP14+EGFR
ALT: 18 IU/L (ref 0–32)
AST: 15 IU/L (ref 0–40)
Albumin: 4.5 g/dL (ref 3.9–4.9)
Alkaline Phosphatase: 122 IU/L — ABNORMAL HIGH (ref 44–121)
BUN/Creatinine Ratio: 10 — ABNORMAL LOW (ref 12–28)
BUN: 12 mg/dL (ref 8–27)
Bilirubin Total: 0.4 mg/dL (ref 0.0–1.2)
CO2: 23 mmol/L (ref 20–29)
Calcium: 10 mg/dL (ref 8.7–10.3)
Chloride: 105 mmol/L (ref 96–106)
Creatinine, Ser: 1.25 mg/dL — ABNORMAL HIGH (ref 0.57–1.00)
Globulin, Total: 2.5 g/dL (ref 1.5–4.5)
Glucose: 86 mg/dL (ref 70–99)
Potassium: 4.5 mmol/L (ref 3.5–5.2)
Sodium: 143 mmol/L (ref 134–144)
Total Protein: 7 g/dL (ref 6.0–8.5)
eGFR: 49 mL/min/{1.73_m2} — ABNORMAL LOW (ref >59)

## 2022-08-25 LAB — CBC WITH DIFFERENTIAL/PLATELET
Basophils Absolute: 0 10*3/uL (ref 0.0–0.2)
Basos: 0 %
EOS (ABSOLUTE): 0.1 10*3/uL (ref 0.0–0.4)
Eos: 1 %
Hematocrit: 44.5 % (ref 34.0–46.6)
Hemoglobin: 14.6 g/dL (ref 11.1–15.9)
Immature Grans (Abs): 0 10*3/uL (ref 0.0–0.1)
Immature Granulocytes: 0 %
Lymphocytes Absolute: 3.7 10*3/uL — ABNORMAL HIGH (ref 0.7–3.1)
Lymphs: 39 %
MCH: 32.2 pg (ref 26.6–33.0)
MCHC: 32.8 g/dL (ref 31.5–35.7)
MCV: 98 fL — ABNORMAL HIGH (ref 79–97)
Monocytes Absolute: 0.7 10*3/uL (ref 0.1–0.9)
Monocytes: 7 %
Neutrophils Absolute: 5.1 10*3/uL (ref 1.4–7.0)
Neutrophils: 53 %
Platelets: 242 10*3/uL (ref 150–450)
RBC: 4.54 x10E6/uL (ref 3.77–5.28)
RDW: 12.3 % (ref 11.7–15.4)
WBC: 9.5 10*3/uL (ref 3.4–10.8)

## 2022-08-25 LAB — CERVICOVAGINAL ANCILLARY ONLY
Bacterial Vaginitis (gardnerella): POSITIVE — AB
Candida Glabrata: NEGATIVE
Candida Vaginitis: NEGATIVE
Chlamydia: NEGATIVE
Comment: NEGATIVE
Comment: NEGATIVE
Comment: NEGATIVE
Comment: NEGATIVE
Comment: NEGATIVE
Comment: NORMAL
Neisseria Gonorrhea: NEGATIVE
Trichomonas: NEGATIVE

## 2022-08-25 MED ORDER — METRONIDAZOLE 500 MG PO TABS
500.0000 mg | ORAL_TABLET | Freq: Two times a day (BID) | ORAL | 0 refills | Status: AC
Start: 2022-08-25 — End: 2022-09-01

## 2022-08-27 LAB — CYTOLOGY - PAP
Comment: NEGATIVE
Diagnosis: NEGATIVE
High risk HPV: NEGATIVE

## 2022-09-07 ENCOUNTER — Other Ambulatory Visit: Payer: Self-pay | Admitting: Family Medicine

## 2022-09-07 DIAGNOSIS — J410 Simple chronic bronchitis: Secondary | ICD-10-CM

## 2022-09-08 NOTE — Telephone Encounter (Signed)
Requested Prescriptions  Pending Prescriptions Disp Refills   VENTOLIN HFA 108 (90 Base) MCG/ACT inhaler [Pharmacy Med Name: Ventolin HFA 108 (90 Base) MCG/ACT Inhalation Aerosol Solution] 18 g 0    Sig: INHALE 2 PUFFS BY MOUTH EVERY 6 HOURS AS NEEDED FOR WHEEZING FOR SHORTNESS OF BREATH     Pulmonology:  Beta Agonists 2 Passed - 09/07/2022 11:09 AM      Passed - Last BP in normal range    BP Readings from Last 1 Encounters:  08/24/22 119/82         Passed - Last Heart Rate in normal range    Pulse Readings from Last 1 Encounters:  08/24/22 86         Passed - Valid encounter within last 12 months    Recent Outpatient Visits           2 weeks ago Encounter for Papanicolaou smear of cervix   [place] [facility] [place], Shea Stakes, NP   4 months ago Essential hypertension   [place] [facility] [place], Shea Stakes, NP   7 months ago Essential hypertension   [place] [facility] [place], Shea Stakes, NP   10 months ago Strep pharyngitis   [facility] [place], Shea Stakes, NP   1 year ago Essential hypertension   [place] [facility] Claiborne Rigg, NP       Future Appointments             In 2 months Claiborne Rigg, NP [facility]

## 2022-09-12 ENCOUNTER — Other Ambulatory Visit: Payer: Self-pay | Admitting: Nurse Practitioner

## 2022-09-14 NOTE — Telephone Encounter (Signed)
Requested Prescriptions  Pending Prescriptions Disp Refills   varenicline (CHANTIX) 1 MG tablet [Pharmacy Med Name: Varenicline Tartrate 1 MG Oral Tablet] 180 tablet 0    Sig: Take 1 tablet by mouth twice daily     Psychiatry:  Drug Dependence Therapy - varenicline Failed - 09/12/2022  9:08 AM      Failed - Manual Review: Do not refill starter pack. 1 mg tabs may be extended up to one year if the patient has quit smoking but still feels at risk for relapse.      Failed - Cr in normal range and within 180 days    Creatinine, Ser  Date Value Ref Range Status  08/24/2022 1.25 (H) 0.57 - 1.00 mg/dL Final         Passed - Completed PHQ-2 or PHQ-9 in the last 360 days      Passed - Valid encounter within last 6 months    Recent Outpatient Visits           3 weeks ago Encounter for Papanicolaou smear of cervix   [place] [facility] [place], Shea Stakes, NP   4 months ago Essential hypertension   [facility] [place], Shea Stakes, NP   7 months ago Essential hypertension   [place] [facility] [place], Shea Stakes, NP   10 months ago Strep pharyngitis   [facility] [place], Shea Stakes, NP   1 year ago Essential hypertension   [place] [facility] Claiborne Rigg, NP       Future Appointments             In 2 months Claiborne Rigg, NP [facility]

## 2022-11-11 ENCOUNTER — Ambulatory Visit: Payer: Self-pay | Admitting: Physician Assistant

## 2022-11-11 ENCOUNTER — Encounter: Payer: Self-pay | Admitting: Physician Assistant

## 2022-11-11 ENCOUNTER — Ambulatory Visit: Payer: Self-pay

## 2022-11-11 ENCOUNTER — Ambulatory Visit (HOSPITAL_COMMUNITY)
Admission: RE | Admit: 2022-11-11 | Discharge: 2022-11-11 | Disposition: A | Discharge status: Home / Self Care | Payer: Self-pay | Source: Ambulatory Visit | Attending: Physician Assistant | Admitting: Physician Assistant

## 2022-11-11 VITALS — BP 116/84 | HR 88 | Ht 69.0 in | Wt 211.0 lb

## 2022-11-11 DIAGNOSIS — M79672 Pain in left foot: Secondary | ICD-10-CM

## 2022-11-11 DIAGNOSIS — M79605 Pain in left leg: Secondary | ICD-10-CM | POA: Insufficient documentation

## 2022-11-11 DIAGNOSIS — M79671 Pain in right foot: Secondary | ICD-10-CM

## 2022-11-11 DIAGNOSIS — R000 Tachycardia, unspecified: Secondary | ICD-10-CM

## 2022-11-11 DIAGNOSIS — M79604 Pain in right leg: Secondary | ICD-10-CM

## 2022-11-11 DIAGNOSIS — R0602 Shortness of breath: Secondary | ICD-10-CM

## 2022-11-11 MED ORDER — GABAPENTIN 100 MG PO CAPS
100.0000 mg | ORAL_CAPSULE | Freq: Two times a day (BID) | ORAL | 1 refills | Status: DC
Start: 2022-11-11 — End: 2022-11-30

## 2022-11-11 NOTE — Progress Notes (Unsigned)
Established Patient Office Visit  Subjective   Patient ID: Audrey Montgomery, female    DOB: July 29, 1960  Age: 62 y.o. MRN: [id number]  Chief Complaint  Patient presents with   Leg Pain    Patient describes as a pain in lower leg extremities, pain started 2 days ago. She is experiencing burning on the bottom of feet, and a crawling sensation on lower legs      States that she started having bilateral lower leg pain that she describes as burning, tender, with numbness.  States this started approximately 2 days ago.  States the pain is worse in her left leg than in her right.  States that pain is waking her out of her sleep.  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  Otherwise denies any other increase in exercise or activity.  States that she does have a significant smoking history, smoked a pack and a half a day for 50 years, quit a couple of months ago.  States that she also has noticed some increased swelling in both ankles, states that it does get worse throughout the day but does improve when she wakes up in the morning.  States that she has been wearing compression stockings without relief.  Also endorses that she has been having episodes of becoming "short winded" while resting.  States that it will last approximately 5 minutes and will feel her heart racing during this time.  States that it happens approximately once a day.  States that she will use her inhaler with relief.  States that she is drinking approximately 8-9 bottles of water a day.    States that she is having difficulty sleeping both falling asleep and staying asleep.  States this has been ongoing for the past year and a half, states that she is sleeping approximately 5 hours a night.  Has not tried anything for relief.        Past Medical History:  Diagnosis Date   Cataract    Both Eyes.   Hypertension    Social History    Socioeconomic History   Marital status: Widowed    Spouse name: Not on file   Number of children: 5   Years of education: Not on file   Highest education level: Some college, no degree  Occupational History   Not on file  Tobacco Use   Smoking status: Former    Average packs/day: 0.5 packs/day for 50.2 years (25.1 ttl pk-yrs)    Types: Cigarettes    Start date: 1974   Smokeless tobacco: Never   Tobacco comments:    Stopped smoking 3 months from 08/24/22  Vaping Use   Vaping status: Never Used  Substance and Sexual Activity   Alcohol use: Not Currently   Drug use: No   Sexual activity: Yes  Other Topics Concern   Not on file  Social History Narrative   Lives at home with her brother. He had a stroke and she cares for him.   Right handed   Social Determinants of Health   Financial Resource Strain: Not on file  Food Insecurity: No Food Insecurity (01/14/2021)   Hunger Vital Sign    Worried About Running Out of Food in the Last Year: Never true    Ran Out of Food in the Last Year: Never true  Transportation Needs: No Transportation Needs (01/14/2021)   PRAPARE - Transportation  Lack of Transportation (Medical): No    Lack of Transportation (Non-Medical): No  Physical Activity: Not on file  Stress: Not on file  Social Connections: Unknown (06/21/2021)   [facility]   Social Network    Social Network: Not on file  Intimate Partner Violence: Unknown (05/13/2021)   [facility]   HITS    Physically Hurt: Not on file    Insult or Talk Down To: Not on file    Threaten Physical Harm: Not on file    Scream or Curse: Not on file   Family History  Problem Relation Age of Onset   Stroke Brother    Peripheral vascular disease Other        father's side   Neuropathy Neg Hx        that she knows of    Breast cancer Neg Hx    No Known Allergies  Review of Systems  Constitutional:  Negative for chills and fever.   HENT: Negative.    Eyes: Negative.   Respiratory:  Positive for shortness of breath. Negative for cough and wheezing.   Cardiovascular:  Positive for leg swelling. Negative for chest pain.  Gastrointestinal: Negative.   Genitourinary: Negative.   Musculoskeletal:  Positive for myalgias.  Skin: Negative.   Neurological: Negative.   Endo/Heme/Allergies: Negative.   Psychiatric/Behavioral:  Negative for depression. The patient has insomnia. The patient is not nervous/anxious.       Objective:     BP 116/84 (BP Location: Left Arm, Patient Position: Sitting, Cuff Size: Large)   Pulse 88   Ht 5\' 9"  (1.753 m)   Wt 211 lb (95.7 kg)   LMP  (LMP Unknown)   SpO2 96%   BMI 31.16 kg/m  BP Readings from Last 3 Encounters:  11/11/22 116/84  08/24/22 119/82  04/22/22 118/76   Wt Readings from Last 3 Encounters:  11/11/22 211 lb (95.7 kg)  08/24/22 214 lb 3.2 oz (97.2 kg)  04/22/22 207 lb (93.9 kg)      Physical Exam Vitals and nursing note reviewed.  Constitutional:      General: She is not in acute distress.    Appearance: Normal appearance.  HENT:     Right Ear: External ear normal.     Left Ear: External ear normal.     Nose: Nose normal.     Mouth/Throat:     Mouth: Mucous membranes are moist.     Pharynx: Oropharynx is clear.  Eyes:     Extraocular Movements: Extraocular movements intact.     Conjunctiva/sclera: Conjunctivae normal.     Pupils: Pupils are equal, round, and reactive to light.  Cardiovascular:     Rate and Rhythm: Normal rate and regular rhythm.     Pulses: Normal pulses.          Dorsalis pedis pulses are 2+ on the right side and 2+ on the left side.       Posterior tibial pulses are 2+ on the right side and 2+ on the left side.     Heart sounds: Normal heart sounds.  Pulmonary:     Effort: Pulmonary effort is normal.     Breath sounds: Normal breath sounds.  Musculoskeletal:     Cervical back: Normal range of motion and neck supple.     Right  lower leg: Tenderness present. No swelling. No edema.     Left lower leg: Tenderness present. No swelling. No edema.  Right ankle: No swelling. Decreased range of motion.     Left ankle: No swelling. Decreased range of motion.     Comments: Positive Homans left side   Skin:    General: Skin is warm and dry.  Neurological:     General: No focal deficit present.     Mental Status: She is alert and oriented to person, place, and time.  Psychiatric:        Mood and Affect: Mood normal.        Behavior: Behavior normal.        Thought Content: Thought content normal.        Judgment: Judgment normal.        Assessment & Plan:   Problem List Items Addressed This Visit   None Visit Diagnoses     Bilateral leg and foot pain    -  Primary   Relevant Medications   gabapentin (NEURONTIN) 100 MG capsule   Other Relevant Orders   Basic metabolic panel (Completed)   Tachycardia       Relevant Orders   TSH (Completed)   Acute leg pain, left       Relevant Orders   VAS [place] LOWER EXTREMITY VENOUS (DVT) (Completed)      1. Bilateral leg and foot pain Patient had positive Denna Haggard' sign left side, was sent for imaging to rule out DVT.  Imaging was negative for DVT.  Trial gabapentin.  Patient education given on supportive care, red flags given for prompt reevaluation. - gabapentin (NEURONTIN) 100 MG capsule; Take 1 capsule (100 mg total) by mouth 2 (two) times daily.  Dispense: 60 capsule; Refill: 1 - Basic metabolic panel  2. Acute leg pain, left  - VAS [place] LOWER EXTREMITY VENOUS (DVT); Future  3. Tachycardia Patient presented with tachycardia, however heart rate did improve.  Stated she did feel a shortness of breath episode when she first presented. - TSH  4. Shortness of breath Encouraged patient to monitor episodes of shortness of breath, follow-up with primary care provider or return to mobile unit.   I have reviewed the patient's medical history (PMH, PSH, Social History,  Family History, Medications, and allergies) , and have been updated if relevant. I spent 30 minutes reviewing chart and  face to face time with patient.    Return if symptoms worsen or fail to improve.    Kasandra Knudsen Mayers, PA-C

## 2022-11-11 NOTE — Telephone Encounter (Addendum)
Chief Complaint: Leg pain  Symptoms: bilateral leg pain, numbness, tingling Frequency: constant  Pertinent Negatives: Patient denies calf pain  Disposition: [] ED /[] Urgent Care (no appt availability in office) / [] Appointment(In office/virtual)/ []  [place] [facility]/ [] Home Care/ [] Refused Recommended Disposition /[x] [place] [facility]/ []  Follow-up with PCP Additional Notes: Patient reports bilateral leg pain that has been ongoing for 2 days now. Patient states she is worried because circulation problems runs in her family. Patient reports chronic ankle swelling as well. Care advice was given and patient was seen today with the mobile bus clinic. Reason for Disposition  Numbness in a leg or foot (i.e., loss of sensation)  Answer Assessment - Initial Assessment Questions 1. ONSET: "When did the pain start?"      2 days ago  2. LOCATION: "Where is the pain located?"      Bilateral knee to feet  3. PAIN: "How bad is the pain?"    (Scale 1-10; or mild, moderate, severe)   -  MILD (1-3): doesn't interfere with normal activities    -  MODERATE (4-7): interferes with normal activities (e.g., work or school) or awakens from sleep, limping    -  SEVERE (8-10): excruciating pain, unable to do any normal activities, unable to walk     5/10 right now  4. WORK OR EXERCISE: "Has there been any recent work or exercise that involved this part of the body?"      Yes, I'm pushing more wheelchairs now 5. CAUSE: "What do you think is causing the leg pain?"     I'm not sure  6. OTHER SYMPTOMS: "Do you have any other symptoms?" (e.g., chest pain, back pain, breathing difficulty, swelling, rash, fever, numbness, weakness)     Tingling, numbness, my feet burn  Protocols used: Leg Pain-A-AH

## 2022-11-11 NOTE — Patient Instructions (Signed)
We will call you with the results as soon as they are available.  Please hold starting the gabapentin until the results return.  Roney Jaffe, PA-C Physician Assistant [facility] Medicine [email or web address]    Deep Vein Thrombosis  Deep vein thrombosis (DVT) is a condition in which a blood clot forms in a vein of the deep venous system. This can occur in the lower leg, thigh, pelvis, arm, or neck. A clot is blood that has thickened into a gel or solid. This condition is serious and can be life-threatening if the clot travels to the arteries of the lungs and causes a blockage (pulmonary embolism). A DVT can also damage veins in the leg, which can lead to long-term venous disease, leg pain, swelling, discoloration, and ulcers or sores (post-thrombotic syndrome). What are the causes? This condition may be caused by: A slowdown of blood flow. Damage to a vein. A condition that causes blood to clot more easily, such as certain bleeding disorders. What increases the risk? The following factors may make you more likely to develop this condition: Obesity. Being older, especially older than age 29. Being inactive or not moving around (sedentary lifestyle). This may include: Sitting or lying down for longer than 4-6 hours other than to sleep at night. Being in the hospital, or having major or lengthy surgery. Having any recent bone injuries, such as breaks (fractures), that reduce movement, especially in the lower extremities. Having recent orthopedic surgery on the lower extremities. Being pregnant, giving birth, or having recently given birth. Taking medicines that contain estrogen, such as birth control or hormone replacement therapy. Using products that contain nicotine or tobacco, especially if you use hormonal birth control. Having a history of a blood vessel disease (peripheral vascular disease) or congestive heart disease. Having a  history of cancer, especially if being treated with chemotherapy. What are the signs or symptoms? Symptoms of this condition include: Swelling, pain, pressure, or tenderness in an arm or a leg. An arm or a leg becoming warm, red, or discolored. A leg turning very pale or blue. You may have a large DVT. This is rare. If the clot is in your leg, you may notice that symptoms get worse when you stand or walk. In some cases, there are no symptoms. How is this diagnosed? This condition is diagnosed with: Your medical history and a physical exam. Tests, such as: Blood tests to check how well your blood clots. Doppler ultrasound. This is the best way to find a DVT. CT venogram. Contrast dye is injected into a vein, and X-rays are taken to check for clots. This is helpful for veins in the chest or pelvis. How is this treated? Treatment for this condition depends on: The cause of your DVT. The size and location of your DVT, or having more than one DVT. Your risk for bleeding or developing more clots. Other medical conditions you may have. Treatment may include: Taking a blood thinner medicine (anticoagulant) to prevent more clots from forming or current clots from growing. Wearing compression stockings. Injecting medicines into the affected vein to break up the clot (catheter-directed thrombolysis). Surgical procedures, when DVT is severe or hard to treat. These may be done to: Isolate and remove your clot. Place an inferior vena cava (IVC) filter. This filter is placed into a large vein called the inferior vena cava to catch blood clots before they reach your lungs. You may get some medical treatments for 6 months or longer. Follow these instructions at  home: If you are taking blood thinners: Talk with your health care provider before you take any medicines that contain aspirin or NSAIDs, such as ibuprofen. These medicines increase your risk for dangerous bleeding. Take your medicine exactly  as told, at the same time every day. Do not skip a dose. Do not take more than the prescribed dose. This is important. Ask your health care provider about foods and medicines that could change or interact with the way your blood thinner works. Avoid these foods and medicines if you are told to do so. Avoid anything that may cause bleeding or bruising. You may bleed more easily while taking blood thinners. Be very careful when using knives, scissors, or other sharp objects. Use an electric razor instead of a blade. Avoid activities that could cause injury or bruising, and follow instructions for preventing falls. Tell your health care provider if you have had any internal bleeding, bleeding ulcers, or neurologic diseases, such as strokes or cerebral aneurysms. Wear a medical alert bracelet or carry a card that lists what medicines you take. General instructions Take over-the-counter and prescription medicines only as told by your health care provider. Return to your normal activities as told by your health care provider. Ask your health care provider what activities are safe for you. If recommended, wear compression stockings as told by your health care provider. These stockings help to prevent blood clots and reduce swelling in your legs. Never wear your compression stockings while sleeping at night. Keep all follow-up visits. This is important. Where to find more information American Heart Association: www.heart.org Centers for Disease Control and Prevention: FootballExhibition.com.br National Heart, Lung, and Blood Institute: PopSteam.is Contact a health care provider if: You miss a dose of your blood thinner. You have unusual bruising or other color changes. You have new or worse pain, swelling, or redness in an arm or a leg. You have worsening numbness or tingling in an arm or a leg. You have a significant color change (pale or blue) in the extremity that has the DVT. Get help right away if: You  have signs or symptoms that a blood clot has moved to the lungs. These may include: Shortness of breath. Chest pain. Fast or irregular heartbeats (palpitations). Light-headedness, dizziness, or fainting. Coughing up blood. You have signs or symptoms that your blood is too thin. These may include: Blood in your vomit, stool, or urine. A cut that will not stop bleeding. A menstrual period that is heavier than usual. A severe headache or confusion. These symptoms may be an emergency. Get help right away. Call 911. Do not wait to see if the symptoms will go away. Do not drive yourself to the hospital. Summary Deep vein thrombosis (DVT) happens when a blood clot forms in a deep vein. This may occur in the lower leg, thigh, pelvis, arm, or neck. Symptoms affect the arm or leg and can include swelling, pain, tenderness, warmth, redness, or discoloration. This condition may be treated with medicines. In severe cases, a procedure or surgery may be done to remove or dissolve the clots. If you are taking blood thinners, take them exactly as told. Do not skip a dose. Do not take more than is prescribed. Get help right away if you have a severe headache, shortness of breath, chest pain, fast or irregular heartbeats, or blood in your vomit, urine, or stool. This information is not intended to replace advice given to you by your health care provider. Make sure you discuss any questions  you have with your health care provider. Document Revised: 08/19/2020 Document Reviewed: 08/19/2020 Elsevier Patient Education  2024 [employer].

## 2022-11-12 ENCOUNTER — Encounter: Payer: Self-pay | Admitting: Physician Assistant

## 2022-11-12 LAB — BASIC METABOLIC PANEL
BUN/Creatinine Ratio: 8 — ABNORMAL LOW (ref 12–28)
BUN: 10 mg/dL (ref 8–27)
CO2: 19 mmol/L — ABNORMAL LOW (ref 20–29)
Calcium: 10 mg/dL (ref 8.7–10.3)
Chloride: 106 mmol/L (ref 96–106)
Creatinine, Ser: 1.2 mg/dL — ABNORMAL HIGH (ref 0.57–1.00)
Glucose: 72 mg/dL (ref 70–99)
Potassium: 4.4 mmol/L (ref 3.5–5.2)
Sodium: 144 mmol/L (ref 134–144)
eGFR: 51 mL/min/{1.73_m2} — ABNORMAL LOW (ref >59)

## 2022-11-12 LAB — TSH: TSH: 1.97 u[IU]/mL (ref 0.450–4.500)

## 2022-11-29 ENCOUNTER — Other Ambulatory Visit: Payer: Self-pay | Admitting: Nurse Practitioner

## 2022-11-29 DIAGNOSIS — I10 Essential (primary) hypertension: Secondary | ICD-10-CM

## 2022-11-30 ENCOUNTER — Ambulatory Visit: Payer: Self-pay | Attending: Nurse Practitioner | Admitting: Nurse Practitioner

## 2022-11-30 ENCOUNTER — Encounter: Payer: Self-pay | Admitting: Nurse Practitioner

## 2022-11-30 ENCOUNTER — Other Ambulatory Visit (HOSPITAL_COMMUNITY)
Admission: RE | Admit: 2022-11-30 | Discharge: 2022-11-30 | Disposition: A | Discharge status: Home / Self Care | Payer: Self-pay | Source: Ambulatory Visit | Attending: Nurse Practitioner | Admitting: Nurse Practitioner

## 2022-11-30 VITALS — BP 124/83 | HR 74 | Ht 69.0 in | Wt 218.6 lb

## 2022-11-30 DIAGNOSIS — I10 Essential (primary) hypertension: Secondary | ICD-10-CM

## 2022-11-30 DIAGNOSIS — R21 Rash and other nonspecific skin eruption: Secondary | ICD-10-CM

## 2022-11-30 DIAGNOSIS — F17210 Nicotine dependence, cigarettes, uncomplicated: Secondary | ICD-10-CM

## 2022-11-30 DIAGNOSIS — N760 Acute vaginitis: Secondary | ICD-10-CM | POA: Insufficient documentation

## 2022-11-30 DIAGNOSIS — F17200 Nicotine dependence, unspecified, uncomplicated: Secondary | ICD-10-CM

## 2022-11-30 DIAGNOSIS — Z1211 Encounter for screening for malignant neoplasm of colon: Secondary | ICD-10-CM

## 2022-11-30 DIAGNOSIS — M792 Neuralgia and neuritis, unspecified: Secondary | ICD-10-CM

## 2022-11-30 MED ORDER — CLOTRIMAZOLE 1 % EX CREA: 1.0000 | TOPICAL_CREAM | Freq: Two times a day (BID) | CUTANEOUS | 0 refills | Status: DC

## 2022-11-30 MED ORDER — GABAPENTIN 600 MG PO TABS
300.0000 mg | ORAL_TABLET | Freq: Three times a day (TID) | ORAL | 1 refills | Status: DC
Start: 2022-11-30 — End: 2023-01-19

## 2022-11-30 MED ORDER — LISINOPRIL 20 MG PO TABS: 20.0000 mg | ORAL_TABLET | Freq: Every day | ORAL | 1 refills | Status: DC

## 2022-11-30 MED ORDER — AMLODIPINE BESYLATE 10 MG PO TABS
10.0000 mg | ORAL_TABLET | Freq: Every day | ORAL | 1 refills | Status: DC
Start: 2022-11-30 — End: 2023-05-07

## 2022-11-30 MED ORDER — VARENICLINE TARTRATE 1 MG PO TABS
1.0000 mg | ORAL_TABLET | Freq: Two times a day (BID) | ORAL | 0 refills | Status: DC
Start: 2022-11-30 — End: 2023-05-07

## 2022-11-30 NOTE — Progress Notes (Signed)
Assessment & Plan:  Audrey Montgomery was seen today for medical management of chronic issues.  Diagnoses and all orders for this visit:  Peripheral neuropathic pain -     gabapentin (NEURONTIN) 600 MG tablet; Take 0.5-1 tablets (300-600 mg total) by mouth 3 (three) times daily. For neuropathy -     Autoimmune Profile  Essential hypertension -     amLODipine (NORVASC) 10 MG tablet; Take 1 tablet (10 mg total) by mouth daily. -     lisinopril (ZESTRIL) 20 MG tablet; Take 1 tablet (20 mg total) by mouth daily. Continue all antihypertensives as prescribed.  Reminded to bring in blood pressure log for follow  up appointment.  RECOMMENDATIONS: DASH/Mediterranean Diets are healthier choices for HTN.    Colon cancer screening -     Fecal occult blood, imunochemical  Tobacco dependence She quit smoking a few months ago.  -     varenicline (CHANTIX) 1 MG tablet; Take 1 tablet (1 mg total) by mouth 2 (two) times daily.  Acute vaginitis -     Cervicovaginal ancillary only  Rash -     clotrimazole (LOTRIMIN) 1 % cream; Apply 1 Application topically 2 (two) times daily. For underarm rash    Patient has been counseled on age-appropriate routine health concerns for screening and prevention. These are reviewed and up-to-date. Referrals have been placed accordingly. Immunizations are up-to-date or declined.    Subjective:   Chief Complaint  Patient presents with   Medical Management of Chronic Issues   HPI Audrey Montgomery 62 y.o. female presents to office today for follow up to neuropathy and b/l underarm rash.   When she sweats she gets a red itchy rash under both underarms. This has been ongoing for a couple of weeks. She denies any rash being present today Has not tried anything over the counter for this  Also feels her ph balance is off. Endorses malodorous vaginal discharge.   Neuropathy: She describes symptoms of burning, tingling, and hypersensitivity"feels like something is  crawling up my legs". Onset of symptoms was  several weeks ago . Symptoms are currently of moderate severity. Symptoms occur intermittently and last hours. The patient denies squeezing but does endorse cramping in her feet. Symptoms are symmetric. Previous treatment has included gabapentin 100 mg BID, which has not improved symptoms.    HTN Blood pressure is well controlled.  BP Readings from Last 3 Encounters:  11/30/22 124/83  11/11/22 116/84  08/24/22 119/82     Review of Systems  Constitutional:  Negative for fever, malaise/fatigue and weight loss.  HENT: Negative.  Negative for nosebleeds.   Eyes: Negative.  Negative for blurred vision, double vision and photophobia.  Respiratory: Negative.  Negative for cough and shortness of breath.   Cardiovascular: Negative.  Negative for chest pain, palpitations and leg swelling.  Gastrointestinal: Negative.  Negative for heartburn, nausea and vomiting.  Genitourinary:        SEE HPI  Musculoskeletal: Negative.  Negative for myalgias.  Skin:  Positive for itching and rash.  Neurological:  Positive for tingling and sensory change. Negative for dizziness, focal weakness, seizures and headaches.  Psychiatric/Behavioral: Negative.  Negative for suicidal ideas.     Past Surgical History:  Procedure Laterality Date   APPENDECTOMY      Family History  Problem Relation Age of Onset   Stroke Brother    Peripheral vascular disease Other        father's side   Neuropathy Neg Hx  that she knows of    Breast cancer Neg Hx     Social History Reviewed with no changes to be made today.   Outpatient Medications Prior to Visit  Medication Sig Dispense Refill   atorvastatin (LIPITOR) 20 MG tablet Take 1 tablet (20 mg total) by mouth daily. 90 tablet 3   fluticasone (FLONASE) 50 MCG/ACT nasal spray Place 2 sprays into both nostrils daily. 16 g 6   loratadine (CLARITIN) 10 MG tablet Take 1 tablet (10 mg total) by mouth daily. 90 tablet 3    VENTOLIN HFA 108 (90 Base) MCG/ACT inhaler INHALE 2 PUFFS BY MOUTH EVERY 6 HOURS AS NEEDED FOR WHEEZING FOR SHORTNESS OF BREATH 18 g 2   amLODipine (NORVASC) 10 MG tablet Take 1 tablet (10 mg total) by mouth daily. 90 tablet 1   gabapentin (NEURONTIN) 100 MG capsule Take 1 capsule (100 mg total) by mouth 2 (two) times daily. 60 capsule 1   lisinopril (ZESTRIL) 20 MG tablet Take 1 tablet (20 mg total) by mouth daily. 90 tablet 1   polyethylene glycol (MIRALAX) 17 g packet Take 17 g by mouth daily. (Patient not taking: Reported on 11/30/2022) 90 each 3   varenicline (CHANTIX) 1 MG tablet Take 1 tablet by mouth twice daily (Patient not taking: Reported on 11/30/2022) 180 tablet 0   Varenicline Tartrate, Starter, (CHANTIX STARTING MONTH PAK) 0.5 MG X 11 & 1 MG X 42 TBPK Take one 0.5 mg tablet by mouth daily x 3 days, increase to one 0.5 mg tablet twice daily x 4 days, increase to one 1 mg tablet 2x daily (Patient not taking: Reported on 11/30/2022) 53 each 0   No facility-administered medications prior to visit.    No Known Allergies     Objective:    BP 124/83 (BP Location: Left Arm, Patient Position: Sitting, Cuff Size: Normal)   Pulse 74   Ht 5\' 9"  (1.753 m)   Wt 218 lb 9.6 oz (99.2 kg)   LMP  (LMP Unknown)   SpO2 97%   BMI 32.28 kg/m  Wt Readings from Last 3 Encounters:  11/30/22 218 lb 9.6 oz (99.2 kg)  11/11/22 211 lb (95.7 kg)  08/24/22 214 lb 3.2 oz (97.2 kg)    Physical Exam       Patient has been counseled extensively about nutrition and exercise as well as the importance of adherence with medications and regular follow-up. The patient was given clear instructions to go to ER or return to medical center if symptoms don't improve, worsen or new problems develop. The patient verbalized understanding.   Follow-up: No follow-ups on file.   Claiborne Rigg, FNP-BC [facility] and [facility] [place], [place] [phone]   11/30/2022, 11:04 AM

## 2022-11-30 NOTE — Patient Instructions (Signed)
Call for polyps Placed in [facility]  [address]  [place], [place] [address] 336 [phone]

## 2022-12-01 LAB — CERVICOVAGINAL ANCILLARY ONLY
Bacterial Vaginitis (gardnerella): POSITIVE — AB
Candida Glabrata: NEGATIVE
Candida Vaginitis: NEGATIVE
Chlamydia: NEGATIVE
Comment: NEGATIVE
Comment: NEGATIVE
Comment: NEGATIVE
Comment: NEGATIVE
Comment: NEGATIVE
Comment: NORMAL
Neisseria Gonorrhea: NEGATIVE
Trichomonas: NEGATIVE

## 2022-12-01 LAB — AUTOIMMUNE PROFILE
Anti Nuclear Antibody (ANA): NEGATIVE
Complement C3, Serum: 138 mg/dL (ref 82–167)
dsDNA Ab: 1 [IU]/mL (ref 0–9)

## 2022-12-01 NOTE — Telephone Encounter (Signed)
Called pharmacy to verify if patient has picked up medication. Receipt confirmed by pharmacy 11/30/22 at 9:48 am . Patient has not picked up Rx at this time. Duplicate request sent

## 2022-12-01 NOTE — Telephone Encounter (Signed)
Requested by interface surescripts. Duplicate request. Receipt confirmed by pharmacy 11/30/22 at 9:48 am.  Requested Prescriptions  Refused Prescriptions Disp Refills   amLODipine (NORVASC) 10 MG tablet [Pharmacy Med Name: amLODIPine Besylate 10 MG Oral Tablet] 90 tablet 0    Sig: Take 1 tablet by mouth once daily     Cardiovascular: Calcium Channel Blockers 2 Passed - 11/29/2022  3:51 PM      Passed - Last BP in normal range    BP Readings from Last 1 Encounters:  11/30/22 124/83         Passed - Last Heart Rate in normal range    Pulse Readings from Last 1 Encounters:  11/30/22 74         Passed - Valid encounter within last 6 months    Recent Outpatient Visits           Yesterday Peripheral neuropathic pain   [facility] [place], Shea Stakes, NP   3 months ago Encounter for Papanicolaou smear of cervix   [facility] [place], Shea Stakes, NP   7 months ago Essential hypertension   [facility] [place], Shea Stakes, NP   10 months ago Essential hypertension   [facility] [place], Shea Stakes, NP   1 year ago Strep pharyngitis   [facility] Claiborne Rigg, NP       Future Appointments             In 3 months Claiborne Rigg, NP [facility]

## 2022-12-03 ENCOUNTER — Encounter: Payer: Self-pay | Admitting: Nurse Practitioner

## 2022-12-04 ENCOUNTER — Other Ambulatory Visit: Payer: Self-pay | Admitting: Nurse Practitioner

## 2022-12-04 DIAGNOSIS — N760 Acute vaginitis: Secondary | ICD-10-CM

## 2022-12-04 MED ORDER — METRONIDAZOLE 500 MG PO TABS: 500.0000 mg | ORAL_TABLET | Freq: Two times a day (BID) | ORAL | 0 refills | Status: AC

## 2023-01-19 ENCOUNTER — Other Ambulatory Visit: Payer: Self-pay | Admitting: Nurse Practitioner

## 2023-01-19 DIAGNOSIS — M792 Neuralgia and neuritis, unspecified: Secondary | ICD-10-CM

## 2023-01-20 IMAGING — MG MM DIGITAL DIAGNOSTIC UNILAT*L* W/ TOMO W/ CAD
4 series · 4 of 12 positions shown · non-contrast
Comparison: Previous exam(s).

CLINICAL DATA: 60-year-old female presenting as a recall from
screening for possible left breast asymmetry.

EXAM:
DIGITAL DIAGNOSTIC UNILATERAL LEFT MAMMOGRAM WITH TOMOSYNTHESIS AND
CAD; ULTRASOUND LEFT BREAST LIMITED
TECHNIQUE: Left digital diagnostic mammography and breast tomosynthesis was
performed. The images were evaluated with computer-aided detection.;
Targeted ultrasound examination of the left breast was performed.

[L ML synth-2D]
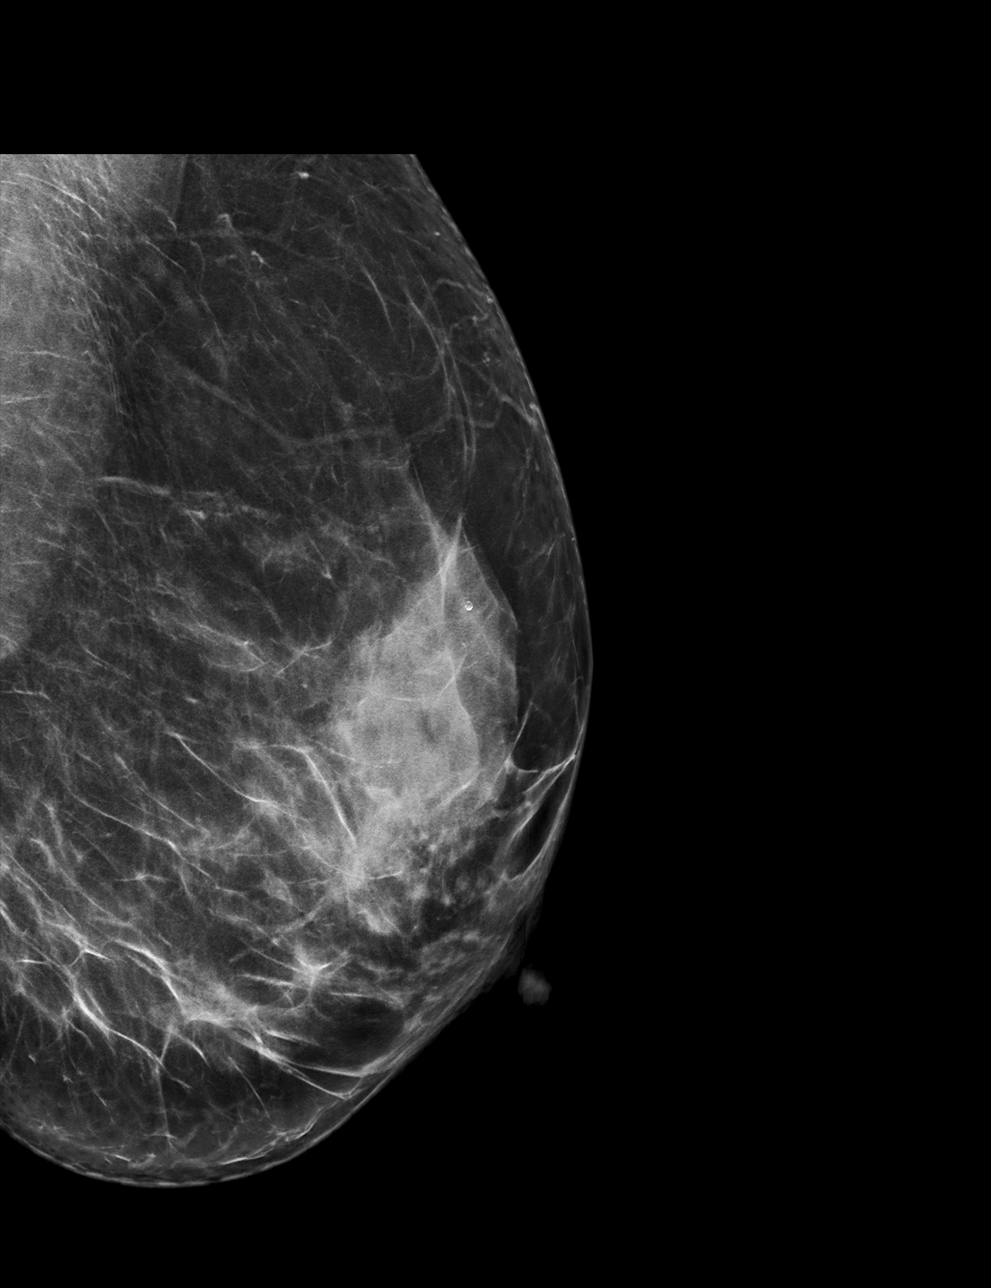

[L CC synth-2D]
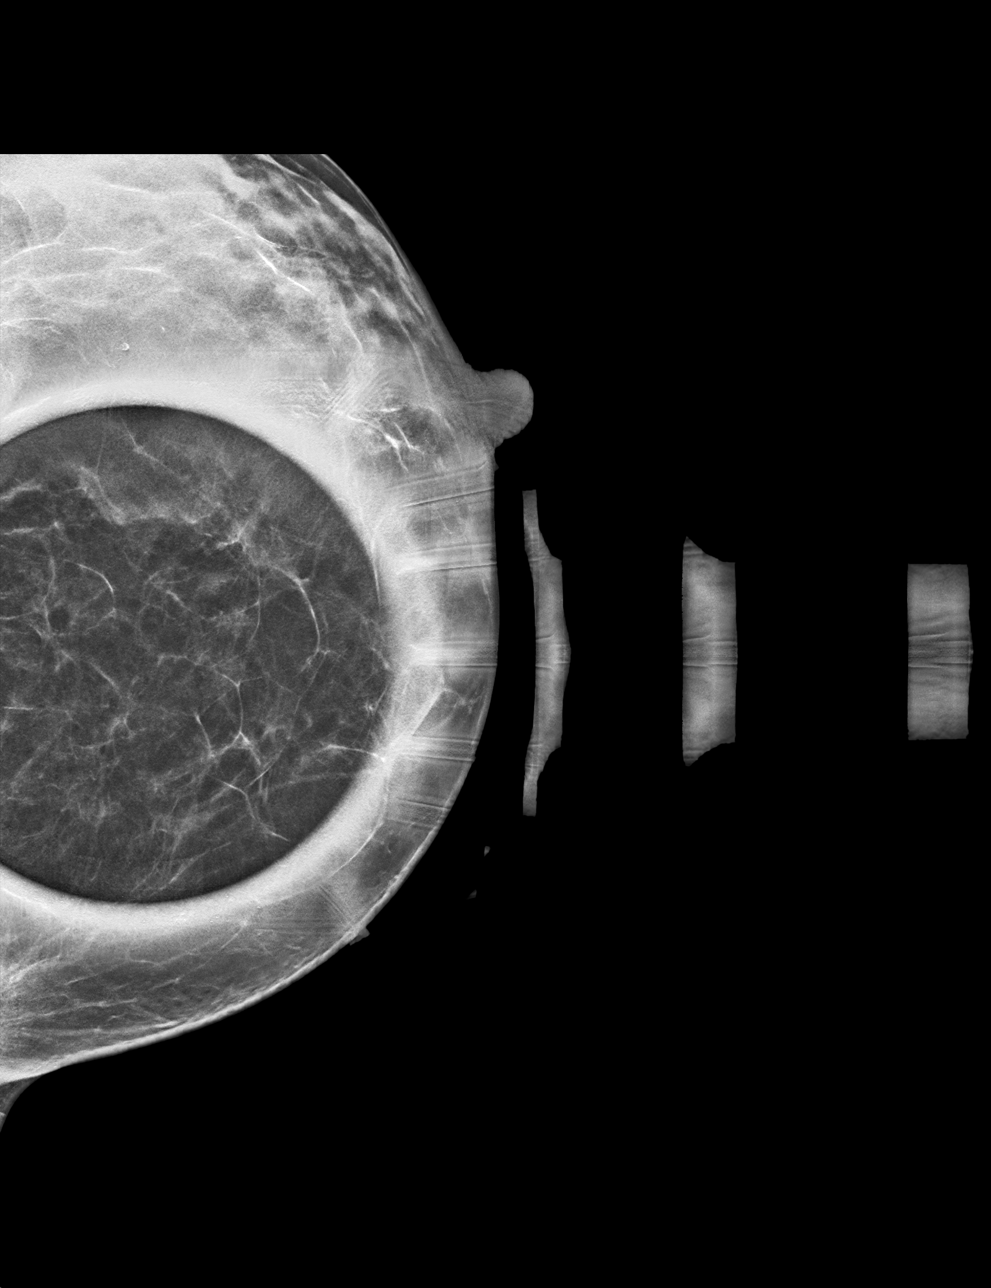

[L CC tomo · tomo slice 25/50.0]
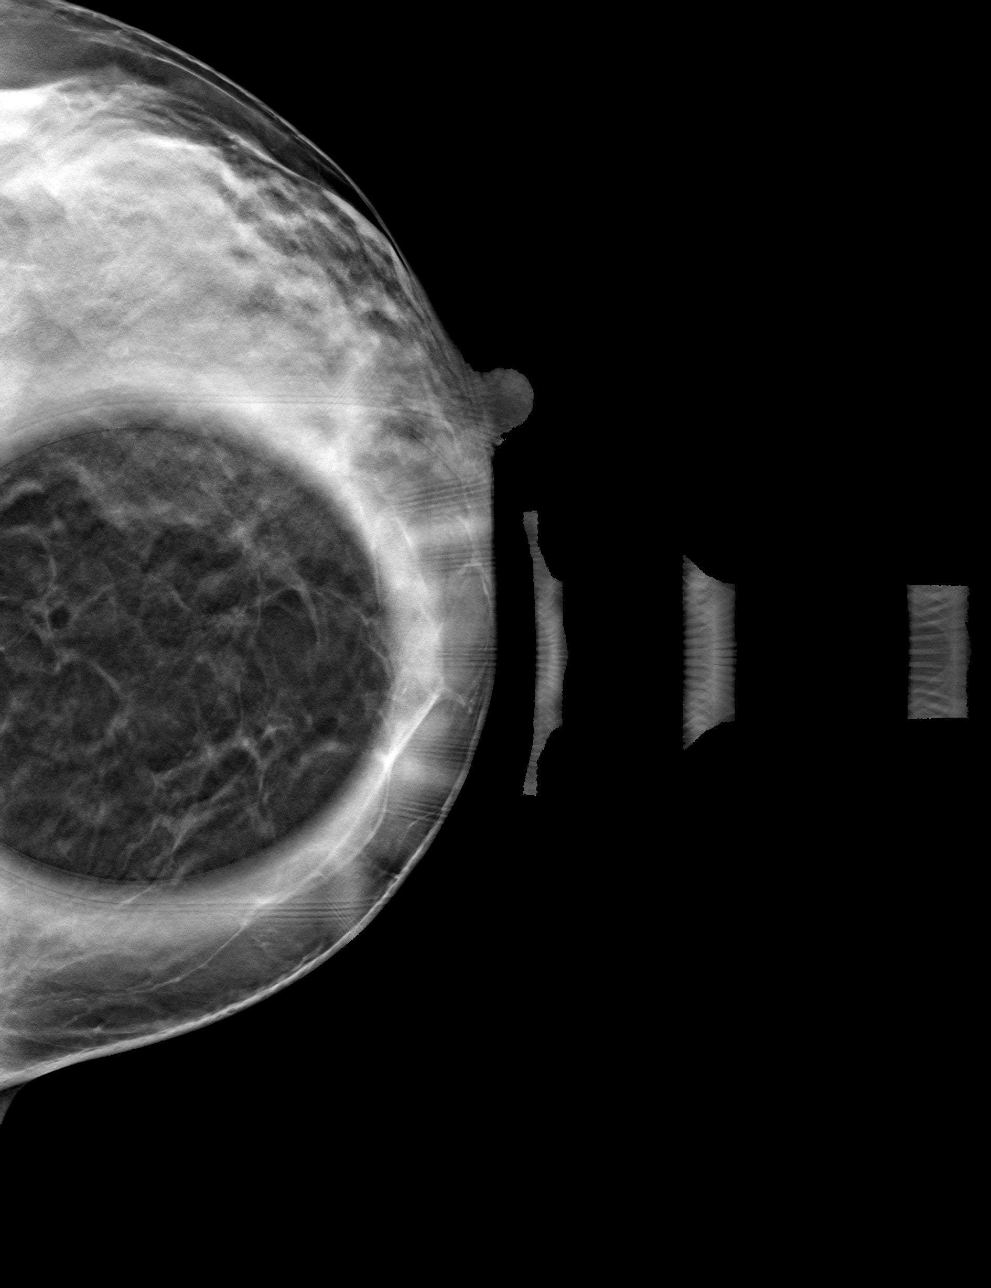

[L ML tomo · tomo slice 37/74.0]
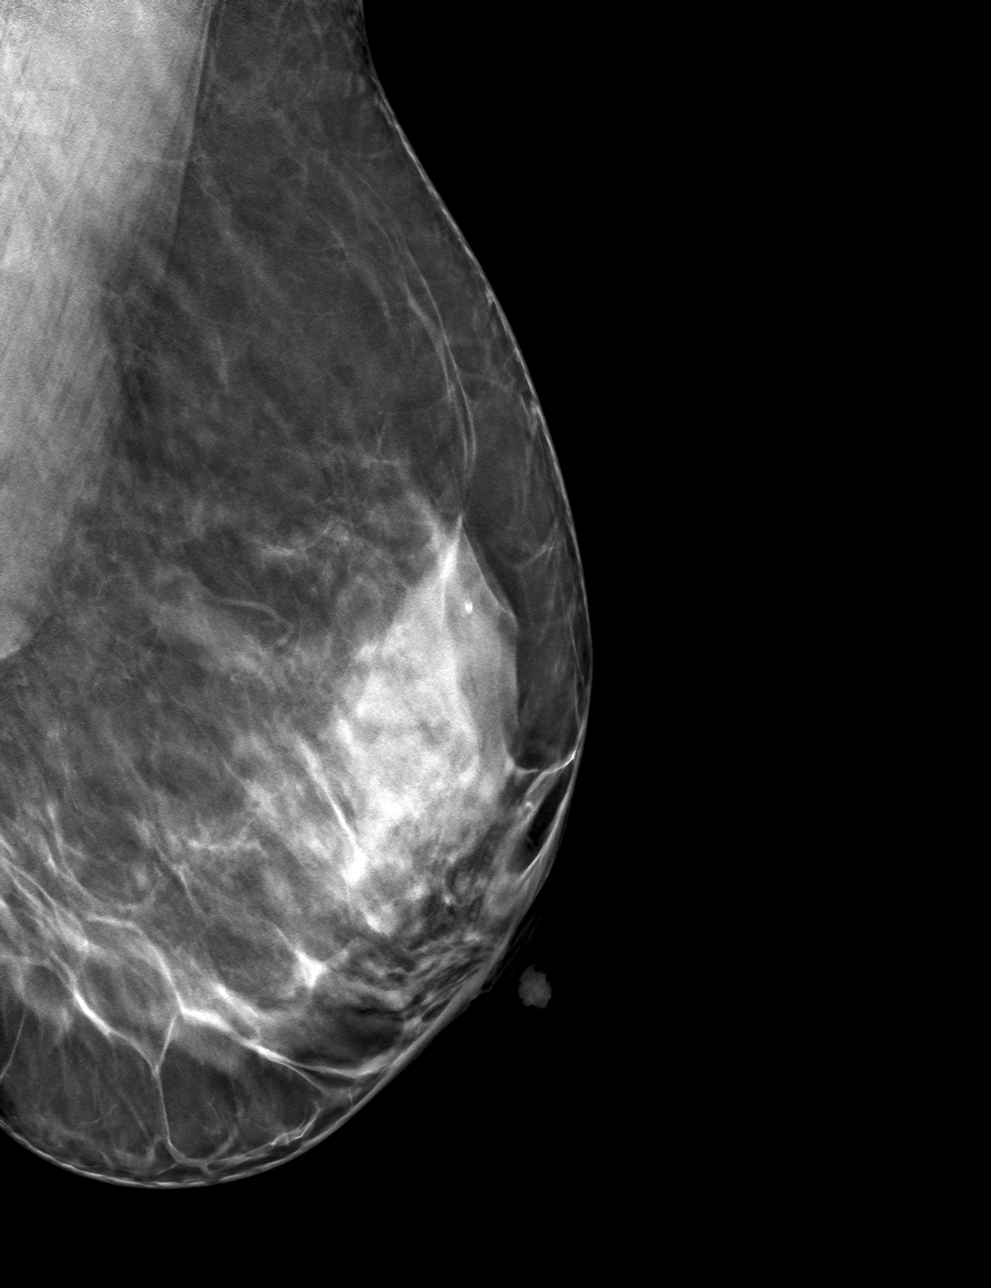

[4 of 12 positions shown; findings below may reference images not displayed]

ACR Breast Density Category c: The breast tissue is heterogeneously
dense, which may obscure small masses.
FINDINGS: Mammogram:

Spot compression tomosynthesis cc and full field mL tomosynthesis
views of the left breast were performed for a small asymmetry seen
only on CC view in the medial left breast. On the additional imaging
the asymmetry partially effaces.

Ultrasound:

Targeted ultrasound performed in the left breast at 10 o'clock 3 cm
from the nipple demonstrating an oval circumscribed hypoechoic mass
measuring 0.4 x 0.3 x 0.6 cm, likely a complicated cyst. No internal
vascularity. This may correspond to the asymmetry identified
mammographically.
IMPRESSION: Probably benign mass in the left breast at 10 o'clock, likely a
complicated cyst.

RECOMMENDATION:
Diagnostic left breast mammogram and ultrasound in 6 months.

I have discussed the findings and recommendations with the patient
who agrees to short-term follow-up. If applicable, a reminder letter
will be sent to the patient regarding the next appointment.

BI-RADS CATEGORY  3: Probably benign.

## 2023-01-21 ENCOUNTER — Other Ambulatory Visit: Payer: Self-pay

## 2023-01-21 ENCOUNTER — Encounter: Payer: Self-pay | Admitting: Obstetrics and Gynecology

## 2023-01-21 ENCOUNTER — Ambulatory Visit: Payer: Commercial Managed Care - HMO | Admitting: Obstetrics and Gynecology

## 2023-01-21 VITALS — BP 118/79 | HR 104 | Wt 218.0 lb

## 2023-01-21 DIAGNOSIS — N840 Polyp of corpus uteri: Secondary | ICD-10-CM

## 2023-01-21 DIAGNOSIS — Z1331 Encounter for screening for depression: Secondary | ICD-10-CM | POA: Diagnosis not present

## 2023-01-21 DIAGNOSIS — N930 Postcoital and contact bleeding: Secondary | ICD-10-CM | POA: Diagnosis not present

## 2023-01-21 NOTE — Progress Notes (Signed)
Scheduled patient for [place] for December 19th at 3:30 PM. Advised patient to arrive 15 minutes prior. Patient will be able to see it via Mychart. Patient confirmed scheduled appointment.  Marcelino Duster, RN

## 2023-01-21 NOTE — Progress Notes (Signed)
NEW GYNECOLOGY PATIENT Patient name: Audrey Montgomery MRN [id number]  Date of birth: 07-08-1960 Chief Complaint:   Gynecologic Exam     History:  Audrey Montgomery is a 62 y.o. G5P5000 being seen today for cervical polyp.    Currently sexually active Has had vaginal discharge, occasionally with odor, no itching associated  No bleeding or pain on a day to day Not sure when her last menopause, > 20 years  No HRT used at any point No vaginal dryness After intercourse she has noticed bleeding - pnkish color; uses lubrication with intercourse, no pain with intercourse and it does not happen every time she has interourse The bleeding is always pink. After cleaning and she voids doesn't see additional bleeding thereafter     Gynecologic History No LMP recorded (lmp unknown). Patient is postmenopausal. Contraception: post menopausal status Last Pap:     Component Value Date/Time   DIAGPAP  08/24/2022 0929    - Negative for intraepithelial lesion or malignancy (NILM)   HPVHIGH Negative 08/24/2022 0929   ADEQPAP  08/24/2022 0929    Satisfactory for evaluation; transformation zone component PRESENT.    High Risk HPV: Positive  Adequacy:  Satisfactory for evaluation, transformation zone component PRESENT  Diagnosis:  Atypical squamous cells of undetermined significance (ASC-US)  Last Mammogram:  02/2022 BIRADS 3 Last Colonoscopy:  cologard  Obstetric History OB History  Gravida Para Term Preterm AB Living  5 5 5      SAB IAB Ectopic Multiple Live Births          # Outcome Date GA Lbr Len/2nd Weight Sex Type Anes PTL Lv  5 Term 09/1992 [redacted]w[redacted]d    Vag-Spont     4 Term 10/1989 [redacted]w[redacted]d    Vag-Spont     3 Term 02/1988 [redacted]w[redacted]d    Vag-Spont     2 Term 11/1979 [redacted]w[redacted]d    Vag-Spont     1 Term 04/1978 [redacted]w[redacted]d    Vag-Spont       Past Medical History:  Diagnosis Date   Cataract    Both Eyes.   Hypertension     Past Surgical History:  Procedure Laterality Date   APPENDECTOMY       Current Outpatient Medications on File Prior to Visit  Medication Sig Dispense Refill   amLODipine (NORVASC) 10 MG tablet Take 1 tablet (10 mg total) by mouth daily. 90 tablet 1   atorvastatin (LIPITOR) 20 MG tablet Take 1 tablet (20 mg total) by mouth daily. 90 tablet 3   fluticasone (FLONASE) 50 MCG/ACT nasal spray Place 2 sprays into both nostrils daily. 16 g 6   gabapentin (NEURONTIN) 600 MG tablet TAKE 1/2 (ONE-HALF)  TO 1 TABLET BY MOUTH THREE TIMES DAILY 90 tablet 0   lisinopril (ZESTRIL) 20 MG tablet Take 1 tablet (20 mg total) by mouth daily. 90 tablet 1   VENTOLIN HFA 108 (90 Base) MCG/ACT inhaler INHALE 2 PUFFS BY MOUTH EVERY 6 HOURS AS NEEDED FOR WHEEZING FOR SHORTNESS OF BREATH 18 g 2   clotrimazole (LOTRIMIN) 1 % cream Apply 1 Application topically 2 (two) times daily. For underarm rash (Patient not taking: Reported on 01/21/2023) 30 g 0   loratadine (CLARITIN) 10 MG tablet Take 1 tablet (10 mg total) by mouth daily. (Patient not taking: Reported on 01/21/2023) 90 tablet 3   varenicline (CHANTIX) 1 MG tablet Take 1 tablet (1 mg total) by mouth 2 (two) times daily. (Patient not taking: Reported on 01/21/2023) 180 tablet 0  No current facility-administered medications on file prior to visit.    No Known Allergies  Social History:  reports that she has quit smoking. Her smoking use included cigarettes. She started smoking about 50 years ago. She has a 25.1 pack-year smoking history. She has never used smokeless tobacco. She reports that she does not currently use alcohol. She reports that she does not use drugs.  Family History  Problem Relation Age of Onset   Stroke Brother    Peripheral vascular disease Other        father's side   Neuropathy Neg Hx        that she knows of    Breast cancer Neg Hx     The following portions of the patient's history were reviewed and updated as appropriate: allergies, current medications, past family history, past medical history, past  social history, past surgical history and problem list.  Review of Systems Pertinent items noted in HPI and remainder of comprehensive ROS otherwise negative.  Physical Exam:  BP 118/79   Pulse (!) 104   Wt 218 lb (98.9 kg)   LMP  (LMP Unknown)   BMI 32.19 kg/m  Physical Exam Vitals and nursing note reviewed. Exam conducted with a chaperone present.  Constitutional:      Appearance: Normal appearance.  Pulmonary:     Effort: Pulmonary effort is normal.  Abdominal:     Palpations: Abdomen is soft.  Genitourinary:    General: Normal vulva.     Exam position: Lithotomy position.  Musculoskeletal:     Comments: Intracervical polyp Mildly tender cervix  Neurological:     Mental Status: She is alert.        Assessment and Plan:   1. Endometrial polyp (Primary) Pelvic US ordered to assess stripe and general uterine structural preoperatively. Schedule hysteroscopic polypectomy - US PELVIC COMPLETE WITH TRANSVAGINAL; Future - Ambulatory Referral For Surgery Scheduling  2. Postcoital bleeding Likely due to polyp - US PELVIC COMPLETE WITH TRANSVAGINAL; Future    Routine preventative health maintenance measures emphasized. Please refer to After Visit Summary for other counseling recommendations.   Follow-up: No follow-ups on file.      Lorriane Shire, MD Obstetrician & Gynecologist, Faculty Practice [facility] [employer], [facility]

## 2023-01-28 ENCOUNTER — Ambulatory Visit (HOSPITAL_COMMUNITY)
Admission: RE | Admit: 2023-01-28 | Discharge: 2023-01-28 | Disposition: A | Discharge status: Home / Self Care | Payer: Commercial Managed Care - HMO | Source: Ambulatory Visit | Attending: Obstetrics and Gynecology | Admitting: Obstetrics and Gynecology

## 2023-01-28 DIAGNOSIS — N930 Postcoital and contact bleeding: Secondary | ICD-10-CM | POA: Diagnosis present

## 2023-01-28 DIAGNOSIS — N840 Polyp of corpus uteri: Secondary | ICD-10-CM | POA: Diagnosis present

## 2023-02-02 ENCOUNTER — Encounter: Payer: Self-pay | Admitting: Obstetrics and Gynecology

## 2023-02-17 ENCOUNTER — Telehealth: Payer: Self-pay

## 2023-02-17 NOTE — Telephone Encounter (Signed)
 Patient called me back and would like to schedule surgery w/ Dr. Jeralyn on 03/15/23 @[facility] at 9 am. Patient states she will arrive at 7 am. Pre-op instructions and surgery details were provided by phone. Patient requested written details be sent to her Mychart.

## 2023-02-17 NOTE — Telephone Encounter (Signed)
 Called patient to see if she was available for surgery on 03/15/23 w/ Dr. Briscoe Deutscher.Left voicemail requesting a call back at [phone].

## 2023-02-21 ENCOUNTER — Other Ambulatory Visit: Payer: Self-pay | Admitting: Nurse Practitioner

## 2023-02-21 DIAGNOSIS — M792 Neuralgia and neuritis, unspecified: Secondary | ICD-10-CM

## 2023-03-02 ENCOUNTER — Ambulatory Visit: Payer: Self-pay | Attending: Nurse Practitioner | Admitting: Nurse Practitioner

## 2023-03-02 ENCOUNTER — Encounter: Payer: Self-pay | Admitting: Nurse Practitioner

## 2023-03-02 VITALS — BP 116/88 | HR 82 | Wt 217.4 lb

## 2023-03-02 DIAGNOSIS — J301 Allergic rhinitis due to pollen: Secondary | ICD-10-CM

## 2023-03-02 DIAGNOSIS — B9689 Other specified bacterial agents as the cause of diseases classified elsewhere: Secondary | ICD-10-CM

## 2023-03-02 DIAGNOSIS — Z1231 Encounter for screening mammogram for malignant neoplasm of breast: Secondary | ICD-10-CM

## 2023-03-02 DIAGNOSIS — E785 Hyperlipidemia, unspecified: Secondary | ICD-10-CM

## 2023-03-02 DIAGNOSIS — J019 Acute sinusitis, unspecified: Secondary | ICD-10-CM

## 2023-03-02 DIAGNOSIS — I10 Essential (primary) hypertension: Secondary | ICD-10-CM

## 2023-03-02 DIAGNOSIS — D72829 Elevated white blood cell count, unspecified: Secondary | ICD-10-CM

## 2023-03-02 MED ORDER — ATORVASTATIN CALCIUM 20 MG PO TABS: 20.0000 mg | ORAL_TABLET | Freq: Every day | ORAL | 3 refills | Status: AC

## 2023-03-02 MED ORDER — AMOXICILLIN-POT CLAVULANATE 875-125 MG PO TABS: 1.0000 | ORAL_TABLET | Freq: Two times a day (BID) | ORAL | 0 refills | Status: AC

## 2023-03-02 MED ORDER — FLUTICASONE PROPIONATE 50 MCG/ACT NA SUSP: 2.0000 | Freq: Every day | NASAL | 6 refills | Status: DC

## 2023-03-02 NOTE — Progress Notes (Signed)
Assessment & Plan:  Audrey Montgomery was seen today for medical management of chronic issues.  Diagnoses and all orders for this visit:  Essential hypertension -     CMP14+EGFR  Leukocytosis, unspecified type -     CBC with Differential  Acute bacterial sinusitis -     amoxicillin-clavulanate (AUGMENTIN) 875-125 MG tablet; Take 1 tablet by mouth 2 (two) times daily for 7 days.  Seasonal allergic rhinitis due to pollen -     fluticasone (FLONASE) 50 MCG/ACT nasal spray; Place 2 sprays into both nostrils daily.  Dyslipidemia, goal LDL below 100 -     atorvastatin (LIPITOR) 20 MG tablet; Take 1 tablet (20 mg total) by mouth daily.  Breast cancer screening by mammogram -     MS 3D SCR MAMMO BILAT BR (aka MM); Future    Patient has been counseled on age-appropriate routine health concerns for screening and prevention. These are reviewed and up-to-date. Referrals have been placed accordingly. Immunizations are up-to-date or declined.    Subjective:   Chief Complaint  Patient presents with   Medical Management of Chronic Issues    Patient state she might hae a possible sinus infection     Audrey Montgomery 63 y.o. female presents to office today for follow up to HTN and with complaints of sinusitis.   She has a past medical history of Cataract and Hypertension.   She is scheduled for cervical polyp removal February 30 2025.  Blood pressure is well-controlled today. BP Readings from Last 3 Encounters:  03/02/23 116/88  01/21/23 118/79  11/30/22 124/83    Sinus Pain: Patient complains of congestion, facial pain, nasal congestion, post nasal drip, purulent nasal discharge, sinus pressure, and sneezing.  Onset of symptoms was  over 1  week ago, gradually worsening since that time. Patient is former smoker, quit 1 year ago. She does have a history of seasonal allergies   Review of Systems  Constitutional:  Negative for fever, malaise/fatigue and weight loss.  HENT:  Positive for  congestion and sinus pain. Negative for nosebleeds.   Eyes: Negative.  Negative for blurred vision, double vision and photophobia.  Respiratory: Negative.  Negative for cough and shortness of breath.   Cardiovascular: Negative.  Negative for chest pain, palpitations and leg swelling.  Gastrointestinal: Negative.  Negative for heartburn, nausea and vomiting.  Musculoskeletal: Negative.  Negative for myalgias.  Neurological: Negative.  Negative for dizziness, focal weakness, seizures and headaches.  Psychiatric/Behavioral: Negative.  Negative for suicidal ideas.     Past Medical History:  Diagnosis Date   Cataract    Both Eyes.   Hypertension     Past Surgical History:  Procedure Laterality Date   APPENDECTOMY      Family History  Problem Relation Age of Onset   Stroke Brother    Peripheral vascular disease Other        father's side   Neuropathy Neg Hx        that she knows of    Breast cancer Neg Hx     Social History Reviewed with no changes to be made today.   Outpatient Medications Prior to Visit  Medication Sig Dispense Refill   amLODipine (NORVASC) 10 MG tablet Take 1 tablet (10 mg total) by mouth daily. 90 tablet 1   clotrimazole (LOTRIMIN) 1 % cream Apply 1 Application topically 2 (two) times daily. For underarm rash 30 g 0   gabapentin (NEURONTIN) 600 MG tablet TAKE 1/2 TO 1 (ONE-HALF TO ONE) TABLET  BY MOUTH THREE TIMES DAILY 90 tablet 0   lisinopril (ZESTRIL) 20 MG tablet Take 1 tablet (20 mg total) by mouth daily. 90 tablet 1   loratadine (CLARITIN) 10 MG tablet Take 1 tablet (10 mg total) by mouth daily. 90 tablet 3   varenicline (CHANTIX) 1 MG tablet Take 1 tablet (1 mg total) by mouth 2 (two) times daily. 180 tablet 0   VENTOLIN HFA 108 (90 Base) MCG/ACT inhaler INHALE 2 PUFFS BY MOUTH EVERY 6 HOURS AS NEEDED FOR WHEEZING FOR SHORTNESS OF BREATH 18 g 2   atorvastatin (LIPITOR) 20 MG tablet Take 1 tablet (20 mg total) by mouth daily. 90 tablet 3   fluticasone  (FLONASE) 50 MCG/ACT nasal spray Place 2 sprays into both nostrils daily. 16 g 6   No facility-administered medications prior to visit.    No Known Allergies     Objective:    BP 116/88 (BP Location: Left Arm, Patient Position: Sitting, Cuff Size: Normal)   Pulse 82   Wt 217 lb 6.4 oz (98.6 kg)   LMP  (LMP Unknown)   SpO2 99%   BMI 32.10 kg/m  Wt Readings from Last 3 Encounters:  03/02/23 217 lb 6.4 oz (98.6 kg)  01/21/23 218 lb (98.9 kg)  11/30/22 218 lb 9.6 oz (99.2 kg)    Physical Exam Vitals and nursing note reviewed.  Constitutional:      Appearance: She is well-developed.  HENT:     Head: Normocephalic and atraumatic.     Nose:     Right Turbinates: Enlarged and swollen. Not pale (erythematous).     Left Turbinates: Enlarged and swollen. Not pale (erythematous).  Cardiovascular:     Rate and Rhythm: Normal rate and regular rhythm.     Heart sounds: Normal heart sounds. No murmur heard.    No friction rub. No gallop.  Pulmonary:     Effort: Pulmonary effort is normal. No tachypnea or respiratory distress.     Breath sounds: Normal breath sounds. No decreased breath sounds, wheezing, rhonchi or rales.  Chest:     Chest wall: No tenderness.  Abdominal:     General: Bowel sounds are normal.     Palpations: Abdomen is soft.  Musculoskeletal:        General: Normal range of motion.     Cervical back: Normal range of motion.  Skin:    General: Skin is warm and dry.  Neurological:     Mental Status: She is alert and oriented to person, place, and time.     Coordination: Coordination normal.  Psychiatric:        Behavior: Behavior normal. Behavior is cooperative.        Thought Content: Thought content normal.        Judgment: Judgment normal.          Patient has been counseled extensively about nutrition and exercise as well as the importance of adherence with medications and regular follow-up. The patient was given clear instructions to go to ER or return  to medical center if symptoms don't improve, worsen or new problems develop. The patient verbalized understanding.   Follow-up: Return in about 3 months (around 05/31/2023).   Claiborne Rigg, FNP-BC [facility] [place], [place] [phone]   03/02/2023, 10:00 AM

## 2023-03-12 ENCOUNTER — Other Ambulatory Visit: Payer: Self-pay

## 2023-03-12 ENCOUNTER — Encounter (HOSPITAL_COMMUNITY): Payer: Self-pay | Admitting: Obstetrics and Gynecology

## 2023-03-12 NOTE — Progress Notes (Signed)
PCP - Claiborne Rigg, NP  EKG - DOS  Anesthesia review: N  Patient verbally denies any shortness of breath, fever, cough and chest pain during phone call   -------------  SDW INSTRUCTIONS given:  Your procedure is scheduled on Monday, February 3rd.  Report to [facility] Main Entrance "A" at 0745 A.M., and check in at the Admitting office.  Call this number if you have problems the morning of surgery:  [phone]   Remember:  Do not eat or drink after midnight the night before your surgery    Take these medicines the morning of surgery with A SIP OF WATER  amLODipine (NORVASC)  fluticasone (FLONASE)  gabapentin (NEURONTIN)  VENTOLIN-if needed (Please bring with you on the day of surgery)  As of today, STOP taking any Aspirin (unless otherwise instructed by your surgeon) Aleve, Naproxen, Ibuprofen, Motrin, Advil, Goody's, BC's, all herbal medications, fish oil, and all vitamins.                      Do not wear jewelry, make up, or nail polish            Do not wear lotions, powders, perfumes/colognes, or deodorant.            Do not shave 48 hours prior to surgery.  Men may shave face and neck.            Do not bring valuables to the hospital.            [facility] is not responsible for any belongings or valuables.  Do NOT Smoke (Tobacco/Vaping) 24 hours prior to your procedure If you use a CPAP at night, you may bring all equipment for your overnight stay.   Contacts, glasses, dentures or bridgework may not be worn into surgery.      For patients admitted to the hospital, discharge time will be determined by your treatment team.   Patients discharged the day of surgery will not be allowed to drive home, and someone needs to stay with them for 24 hours.    Special instructions:   [place]- Preparing For Surgery  Before surgery, you can play an important role. Because skin is not sterile, your skin needs to be as free of germs as possible. You can reduce the  number of germs on your skin by washing with CHG (chlorahexidine gluconate) Soap before surgery.  CHG is an antiseptic cleaner which kills germs and bonds with the skin to continue killing germs even after washing.    Oral Hygiene is also important to reduce your risk of infection.  Remember - BRUSH YOUR TEETH THE MORNING OF SURGERY WITH YOUR REGULAR TOOTHPASTE  Please do not use if you have an allergy to CHG or antibacterial soaps. If your skin becomes reddened/irritated stop using the CHG.  Do not shave (including legs and underarms) for at least 48 hours prior to first CHG shower. It is OK to shave your face.  Please follow these instructions carefully.   Shower the NIGHT BEFORE SURGERY and the MORNING OF SURGERY with DIAL Soap.   Pat yourself dry with a CLEAN TOWEL.  Wear CLEAN PAJAMAS to bed the night before surgery  Place CLEAN SHEETS on your bed the night of your first shower and DO NOT SLEEP WITH PETS.   Day of Surgery: Please shower morning of surgery  Wear Clean/Comfortable clothing the morning of surgery Do not apply any deodorants/lotions.   Remember to brush  your teeth WITH YOUR REGULAR TOOTHPASTE.   Questions were answered. Patient verbalized understanding of instructions.

## 2023-03-12 NOTE — Progress Notes (Signed)
Anesthesia Chart Review: Audrey Montgomery  Case: [id number] Date/Time: 03/15/23 0959   Procedure: DILATATION & CURETTAGE/HYSTEROSCOPY WITH MYOSURE   Anesthesia type: Choice   Pre-op diagnosis:      Postcoital bleeding     Polyp   Location: [facility] OR ROOM 08 / [facility] OR   Surgeons: Lorriane Shire, MD       DISCUSSION: Patient is a 63 year old female scheduled for the above procedure. Evaluated by GYN on 01/21/23 for postcoital bleeding,  01/28/23 op-normal endometrial thickness with 0.5 cm anechoic cyst within the fundal segment. Gynecological consultation and hysteroscopy is recommended for further evaluation.    History includes former smoker, HTN, asthma, appendectomy.  I called and spoke with patient because she had routine follow-up for chronic medical management with PCP Claiborne Rigg, NP on 03/02/23. She reported that she felt like she was developing recurrent sinusitis and was given a Flonase for allergic rhinitis and Augmentin. She tells me that she has history of frequent sinusitis. Symptoms will present with facial pain and pressure around her eye and sinus drainage. She is unsure of the triggers. She said symptoms started about 10 days prior (~ 02/20/23) and were not otherwise associated with sick symptoms like fever, cough, sore throat. She facial pressure and sinus congestion resolved after 2 days. She feels completely at baseline. It sounds like her initial trigger could have been associated with allergies as she never had typical URI symptoms. No coughing, hoarseness, dyspnea, wheezing noted.  Of note, EKG from 12/23/20 showed ST at 151. This was during an ED visit for prosthetic tooth infection with facial pain and swelling. She left the ED because her dentist was able to see her. She has not had an EKG since, but has had routine and regular primary care follow-up. She denied known CAD/MI or CHF history. She has a ~ 24 year old brother (who will be with her on the day of surgery) who  had cardiac surgery (reported not for MI) and is now on anticoagulation. She had another brother who died around age 1 for a suspected MI but had a history of multiple CVA and had stopped taking any of his medications. She in not aware of any genetic cardiac disease such as cardiomyopathy. She denied chest pain, SOB, edema, syncope, palpitations. She says she is able to clean her house and go up a flight of stairs without any CV symptoms.   She will need updated labs and EKG on arrival. Anesthesia team to evaluate on the day of surgery.   VS: Ht 5\' 9"  (1.753 m)   Wt 98.4 kg   LMP  (LMP Unknown)   BMI 32.05 kg/m  BP Readings from Last 3 Encounters:  03/02/23 116/88  01/21/23 118/79  11/30/22 124/83   Pulse Readings from Last 3 Encounters:  03/02/23 82  01/21/23 (!) 104  11/30/22 74     PROVIDERS: Claiborne Rigg, NP is PCP    LABS: For day of surgery as indicated. As of 11/11/22, Cr 1.20, glucose 72 and H/H 14.6/44.5 on 08/24/22.    IMAGES: US Pelvic 01/28/23: IMPRESSION: 1. Top-normal endometrial thickness with 0.5 cm anechoic cyst within the fundal segment. Gynecological consultation and hysteroscopy is recommended for further evaluation.   MRI Liver 07/13/22: IMPRESSION: Lobulated, fluid signal liver lesion in posterior right lobe of the liver, hepatic segment VI/VII measuring 2.6 x 2.1 cm, signal and enhancement characteristics consistent with a benign hepatic hemangioma. No further follow-up or characterization is required for  this benign lesion.   CT Chest LCS 05/21/22: IMPRESSION: 1. Lung-RADS 2S, benign appearance or behavior. Continue annual screening with low-dose chest CT without contrast in 12 months. S modifier for coronary artery calcifications and liver lesion. 2. Indeterminate low-attenuation lesion of the right hepatic lobe. Recommend contrast-enhanced liver MRI for further evaluation. 3. Mild coronary artery calcifications, recommend ASCVD  risk assessment. 4. Aortic Atherosclerosis (ICD10-I70.0) and Emphysema (ICD10-J43.9).     EKG: Last EKG is > 65 year old. EKG on 12/23/20 (done during ED visit for right prosthetic tooth and right upper lip pain and swelling) showed ST @ 151 bpm, LAD, anterolateral ST/T wave abnormality. She left ED prior to full evaluation to get evaluated by her dentist.    CV: LE Venous US 11/11/22: Summary:  RIGHT:  - No evidence of common femoral vein obstruction.  LEFT:  - There is no evidence of deep vein thrombosis in the lower extremity.  - No cystic structure found in the popliteal fossa.       Past Medical History:  Diagnosis Date   Asthma    Cataract    Both Eyes.   Hypertension     Past Surgical History:  Procedure Laterality Date   APPENDECTOMY     CATARACT EXTRACTION Bilateral    02/24    MEDICATIONS: No current facility-administered medications for this encounter.    amLODipine (NORVASC) 10 MG tablet   atorvastatin (LIPITOR) 20 MG tablet   clotrimazole (LOTRIMIN) 1 % cream   fluticasone (FLONASE) 50 MCG/ACT nasal spray   gabapentin (NEURONTIN) 600 MG tablet   lisinopril (ZESTRIL) 20 MG tablet   loratadine (CLARITIN) 10 MG tablet   varenicline (CHANTIX) 1 MG tablet   VENTOLIN HFA 108 (90 Base) MCG/ACT inhaler    Shonna Chock, PA-C Surgical Short Stay/Anesthesiology [facility] Phone [phone] [facility] Phone [phone] 03/12/2023 1:48 PM

## 2023-03-12 NOTE — Anesthesia Preprocedure Evaluation (Signed)
Anesthesia Evaluation  Patient identified by MRN, date of birth, ID band Patient awake    Reviewed: Allergy & Precautions, NPO status , Patient's Chart, lab work & pertinent test results  Airway Mallampati: II       Dental no notable dental hx.    Pulmonary asthma , former smoker   Pulmonary exam normal breath sounds clear to auscultation       Cardiovascular hypertension, Pt. on medications Normal cardiovascular exam Rhythm:Regular Rate:Normal     Neuro/Psych  Headaches  negative psych ROS   GI/Hepatic negative GI ROS, Neg liver ROS,,,  Endo/Other  Obesity HLD  Renal/GU negative Renal ROS  negative genitourinary   Musculoskeletal  (+) Arthritis , Osteoarthritis,    Abdominal  (+) + obese  Peds  Hematology negative hematology ROS (+)   Anesthesia Other Findings   Reproductive/Obstetrics Post coital bleeding Endometrial polyp                             Anesthesia Physical Anesthesia Plan  ASA: 2  Anesthesia Plan: General   Post-op Pain Management: Minimal or no pain anticipated and Precedex   Induction: Intravenous  PONV Risk Score and Plan: 4 or greater and Treatment may vary due to age or medical condition, Midazolam, Ondansetron and Dexamethasone  Airway Management Planned: LMA  Additional Equipment: None  Intra-op Plan:   Post-operative Plan: Extubation in OR  Informed Consent: I have reviewed the patients History and Physical, chart, labs and discussed the procedure including the risks, benefits and alternatives for the proposed anesthesia with the patient or authorized representative who has indicated his/her understanding and acceptance.     Dental advisory given  Plan Discussed with: CRNA and Anesthesiologist  Anesthesia Plan Comments: (PAT note written 03/12/2023 by Shonna Chock, PA-C.  )       Anesthesia Quick Evaluation

## 2023-03-15 ENCOUNTER — Encounter (HOSPITAL_COMMUNITY)
Admission: RE | Disposition: A | Discharge status: Home / Self Care | Payer: Self-pay | Source: Home / Self Care | Attending: Obstetrics and Gynecology

## 2023-03-15 ENCOUNTER — Other Ambulatory Visit: Payer: Self-pay

## 2023-03-15 ENCOUNTER — Ambulatory Visit (HOSPITAL_COMMUNITY): Payer: Self-pay | Admitting: Vascular Surgery

## 2023-03-15 ENCOUNTER — Ambulatory Visit (HOSPITAL_BASED_OUTPATIENT_CLINIC_OR_DEPARTMENT_OTHER): Payer: Self-pay | Admitting: Vascular Surgery

## 2023-03-15 ENCOUNTER — Ambulatory Visit (HOSPITAL_COMMUNITY)
Admission: RE | Admit: 2023-03-15 | Discharge: 2023-03-15 | Disposition: A | Discharge status: Home / Self Care | Payer: Self-pay | Attending: Obstetrics and Gynecology | Admitting: Obstetrics and Gynecology

## 2023-03-15 ENCOUNTER — Encounter (HOSPITAL_COMMUNITY): Payer: Self-pay | Admitting: Obstetrics and Gynecology

## 2023-03-15 ENCOUNTER — Telehealth: Payer: Self-pay | Admitting: Obstetrics and Gynecology

## 2023-03-15 DIAGNOSIS — I700 Atherosclerosis of aorta: Secondary | ICD-10-CM | POA: Insufficient documentation

## 2023-03-15 DIAGNOSIS — Z87891 Personal history of nicotine dependence: Secondary | ICD-10-CM | POA: Insufficient documentation

## 2023-03-15 DIAGNOSIS — J439 Emphysema, unspecified: Secondary | ICD-10-CM | POA: Insufficient documentation

## 2023-03-15 DIAGNOSIS — N840 Polyp of corpus uteri: Secondary | ICD-10-CM | POA: Insufficient documentation

## 2023-03-15 DIAGNOSIS — I10 Essential (primary) hypertension: Secondary | ICD-10-CM | POA: Insufficient documentation

## 2023-03-15 DIAGNOSIS — N841 Polyp of cervix uteri: Secondary | ICD-10-CM | POA: Insufficient documentation

## 2023-03-15 DIAGNOSIS — N930 Postcoital and contact bleeding: Secondary | ICD-10-CM | POA: Insufficient documentation

## 2023-03-15 DIAGNOSIS — Z6832 Body mass index (BMI) 32.0-32.9, adult: Secondary | ICD-10-CM | POA: Insufficient documentation

## 2023-03-15 DIAGNOSIS — I2510 Atherosclerotic heart disease of native coronary artery without angina pectoris: Secondary | ICD-10-CM | POA: Insufficient documentation

## 2023-03-15 DIAGNOSIS — E669 Obesity, unspecified: Secondary | ICD-10-CM | POA: Insufficient documentation

## 2023-03-15 DIAGNOSIS — J45909 Unspecified asthma, uncomplicated: Secondary | ICD-10-CM | POA: Insufficient documentation

## 2023-03-15 HISTORY — PX: DILATATION & CURETTAGE/HYSTEROSCOPY WITH MYOSURE: SHX6511

## 2023-03-15 HISTORY — DX: Unspecified asthma, uncomplicated: J45.909

## 2023-03-15 LAB — CBC
HCT: 45.9 % (ref 36.0–46.0)
Hemoglobin: 14.7 g/dL (ref 12.0–15.0)
MCH: 32 pg (ref 26.0–34.0)
MCHC: 32 g/dL (ref 30.0–36.0)
MCV: 100 fL (ref 80.0–100.0)
Platelets: 199 10*3/uL (ref 150–400)
RBC: 4.59 MIL/uL (ref 3.87–5.11)
RDW: 13.1 % (ref 11.5–15.5)
WBC: 8.8 10*3/uL (ref 4.0–10.5)
nRBC: 0 % (ref 0.0–0.2)

## 2023-03-15 LAB — BASIC METABOLIC PANEL
Anion gap: 10 (ref 5–15)
BUN: 10 mg/dL (ref 8–23)
CO2: 20 mmol/L — ABNORMAL LOW (ref 22–32)
Calcium: 9.1 mg/dL (ref 8.9–10.3)
Chloride: 112 mmol/L — ABNORMAL HIGH (ref 98–111)
Creatinine, Ser: 1.04 mg/dL — ABNORMAL HIGH (ref 0.44–1.00)
GFR, Estimated: >60 mL/min (ref >60)
Glucose, Bld: 90 mg/dL (ref 70–99)
Potassium: 4.2 mmol/L (ref 3.5–5.1)
Sodium: 142 mmol/L (ref 135–145)

## 2023-03-15 LAB — POCT I-STAT, CHEM 8
BUN: 11 mg/dL (ref 8–23)
Calcium, Ion: 1.04 mmol/L — ABNORMAL LOW (ref 1.15–1.40)
Chloride: 113 mmol/L — ABNORMAL HIGH (ref 98–111)
Creatinine, Ser: 1.2 mg/dL — ABNORMAL HIGH (ref 0.44–1.00)
Glucose, Bld: 94 mg/dL (ref 70–99)
HCT: 45 % (ref 36.0–46.0)
Hemoglobin: 15.3 g/dL — ABNORMAL HIGH (ref 12.0–15.0)
Potassium: 4.1 mmol/L (ref 3.5–5.1)
Sodium: 142 mmol/L (ref 135–145)
TCO2: 21 mmol/L — ABNORMAL LOW (ref 22–32)

## 2023-03-15 SURGERY — DILATATION & CURETTAGE/HYSTEROSCOPY WITH MYOSURE
Anesthesia: General | Site: Uterus

## 2023-03-15 MED ORDER — EPHEDRINE SULFATE-NACL 50-0.9 MG/10ML-% IV SOSY
PREFILLED_SYRINGE | INTRAVENOUS | Status: DC | PRN
  Administered 2023-03-15 (×2): 5 mg via INTRAVENOUS

## 2023-03-15 MED ORDER — BUPIVACAINE HCL (PF) 0.5 % IJ SOLN
INTRAMUSCULAR | Status: DC | PRN
  Administered 2023-03-15: 20 mL

## 2023-03-15 MED ORDER — PHENYLEPHRINE 80 MCG/ML (10ML) SYRINGE FOR IV PUSH (FOR BLOOD PRESSURE SUPPORT)
PREFILLED_SYRINGE | INTRAVENOUS | Status: DC | PRN
  Administered 2023-03-15 (×2): 80 ug via INTRAVENOUS
  Administered 2023-03-15: 200 ug via INTRAVENOUS

## 2023-03-15 MED ORDER — FENTANYL CITRATE (PF) 250 MCG/5ML IJ SOLN
INTRAMUSCULAR | Status: AC
  Filled 2023-03-15: qty 5

## 2023-03-15 MED ORDER — OXYCODONE HCL 5 MG PO TABS: 5.0000 mg | ORAL_TABLET | Freq: Once | ORAL | Status: DC | PRN

## 2023-03-15 MED ORDER — ONDANSETRON HCL 4 MG/2ML IJ SOLN
INTRAMUSCULAR | Status: DC | PRN
  Administered 2023-03-15: 4 mg via INTRAVENOUS

## 2023-03-15 MED ORDER — BUPIVACAINE HCL (PF) 0.5 % IJ SOLN
INTRAMUSCULAR | Status: AC
  Filled 2023-03-15: qty 30

## 2023-03-15 MED ORDER — CHLORHEXIDINE GLUCONATE 0.12 % MT SOLN
15.0000 mL | Freq: Once | OROMUCOSAL | Status: AC
  Administered 2023-03-15: 15 mL via OROMUCOSAL
  Filled 2023-03-15: qty 15

## 2023-03-15 MED ORDER — OXYCODONE HCL 5 MG/5ML PO SOLN: 5.0000 mg | Freq: Once | ORAL | Status: DC | PRN

## 2023-03-15 MED ORDER — PROPOFOL 10 MG/ML IV BOLUS
INTRAVENOUS | Status: DC | PRN
  Administered 2023-03-15: 150 mg via INTRAVENOUS

## 2023-03-15 MED ORDER — FENTANYL CITRATE (PF) 100 MCG/2ML IJ SOLN: 25.0000 ug | INTRAMUSCULAR | Status: DC | PRN

## 2023-03-15 MED ORDER — DROPERIDOL 2.5 MG/ML IJ SOLN: 0.6250 mg | Freq: Once | INTRAMUSCULAR | Status: DC | PRN

## 2023-03-15 MED ORDER — ACETAMINOPHEN 500 MG PO TABS
1000.0000 mg | ORAL_TABLET | ORAL | Status: AC
  Administered 2023-03-15: 1000 mg via ORAL
  Filled 2023-03-15: qty 2

## 2023-03-15 MED ORDER — LACTATED RINGERS IV SOLN: INTRAVENOUS | Status: DC

## 2023-03-15 MED ORDER — ORAL CARE MOUTH RINSE: 15.0000 mL | Freq: Once | OROMUCOSAL | Status: AC

## 2023-03-15 MED ORDER — FENTANYL CITRATE (PF) 250 MCG/5ML IJ SOLN
INTRAMUSCULAR | Status: DC | PRN
  Administered 2023-03-15: 50 ug via INTRAVENOUS

## 2023-03-15 MED ORDER — ONDANSETRON HCL 4 MG/2ML IJ SOLN: 4.0000 mg | Freq: Once | INTRAMUSCULAR | Status: DC | PRN

## 2023-03-15 MED ORDER — MIDAZOLAM HCL 2 MG/2ML IJ SOLN
INTRAMUSCULAR | Status: DC | PRN
  Administered 2023-03-15: 2 mg via INTRAVENOUS

## 2023-03-15 MED ORDER — PROPOFOL 10 MG/ML IV BOLUS
INTRAVENOUS | Status: AC
  Filled 2023-03-15: qty 20

## 2023-03-15 MED ORDER — MIDAZOLAM HCL 2 MG/2ML IJ SOLN
INTRAMUSCULAR | Status: AC
  Filled 2023-03-15: qty 2

## 2023-03-15 MED ORDER — PHENYLEPHRINE 80 MCG/ML (10ML) SYRINGE FOR IV PUSH (FOR BLOOD PRESSURE SUPPORT)
PREFILLED_SYRINGE | INTRAVENOUS | Status: AC
  Filled 2023-03-15: qty 10

## 2023-03-15 MED ORDER — DEXAMETHASONE SODIUM PHOSPHATE 10 MG/ML IJ SOLN
INTRAMUSCULAR | Status: DC | PRN
  Administered 2023-03-15: 10 mg via INTRAVENOUS

## 2023-03-15 MED ORDER — LIDOCAINE 2% (20 MG/ML) 5 ML SYRINGE
INTRAMUSCULAR | Status: DC | PRN
  Administered 2023-03-15: 100 mg via INTRAVENOUS

## 2023-03-15 MED ORDER — EPHEDRINE 5 MG/ML INJ
INTRAVENOUS | Status: AC
  Filled 2023-03-15: qty 5

## 2023-03-15 MED ORDER — KETOROLAC TROMETHAMINE 30 MG/ML IJ SOLN
INTRAMUSCULAR | Status: DC | PRN
  Administered 2023-03-15: 15 mg via INTRAVENOUS

## 2023-03-15 SURGICAL SUPPLY — 16 items
CATH ROBINSON RED A/P 16FR (CATHETERS) IMPLANT
DEVICE MYOSURE LITE (MISCELLANEOUS) IMPLANT
DEVICE MYOSURE REACH (MISCELLANEOUS) IMPLANT
GAUZE 4X4 16PLY ~~LOC~~+RFID DBL (SPONGE) IMPLANT
GLOVE BIOGEL PI IND STRL 7.0 (GLOVE) IMPLANT
GLOVE ECLIPSE 7.0 STRL STRAW (GLOVE) ×1 IMPLANT
GOWN STRL REUS W/TWL LRG LVL3 (GOWN DISPOSABLE) ×1 IMPLANT
GOWN STRL REUS W/TWL XL LVL3 (GOWN DISPOSABLE) ×1 IMPLANT
KIT PROCEDURE FLUENT (KITS) ×1 IMPLANT
KIT TURNOVER KIT A (KITS) ×1 IMPLANT
MYOSURE XL FIBROID (MISCELLANEOUS) IMPLANT
PACK VAGINAL MINOR WOMEN LF (CUSTOM PROCEDURE TRAY) ×1 IMPLANT
PAD OB MATERNITY 4.3X12.25 (PERSONAL CARE ITEMS) ×1 IMPLANT
SEAL CERVICAL OMNI LOK (ABLATOR) IMPLANT
SEAL ROD LENS SCOPE MYOSURE (ABLATOR) ×1 IMPLANT
SYSTEM TISS REMOVAL MYOSURE XL (MISCELLANEOUS) IMPLANT

## 2023-03-15 NOTE — Telephone Encounter (Signed)
Spoke to patient about scheduling a virtual visit with Dr. Briscoe Deutscher.

## 2023-03-15 NOTE — Op Note (Signed)
Audrey Montgomery PROCEDURE DATE: 03/15/2023  PREOPERATIVE DIAGNOSIS: postcoital bleeding, polyp  POSTOPERATIVE DIAGNOSIS: same PROCEDURE:    hysteroscopic polypectomy, dilation and curettage SURGEON: Lorriane Shire, MD ASSISTANT:  none  INDICATIONS: 63 y.o. G5P5000 with postcoital bleeding and polyp noted on exam.  Risks of surgery were discussed with the patient including but not limited to: bleeding which may require transfusion; infection which may require antibiotics; injury to surrounding organs; need for additional procedures including laparotomy;  and other postoperative/anesthesia complications. Written informed consent was obtained.    FINDINGS:  Normal external genitalia, normal appearing cervix  Hysteroscopically: cervical polyp, endometrial polyp and sessile polyp, bilateral tubal ostia visualized   ANESTHESIA: General, paracervical block INTRAVENOUS FLUIDS:  600 ml of LR ESTIMATED BLOOD LOSS:  5 ml SPECIMENS: cervical polyp, endometrial polyp and curettings COMPLICATIONS:  None immediate.  FLUID DEFICIT: of normal saline  PROCEDURE: The patient was taken to the operating room and placed under general anesthesia. SCDs were in place.  Time out was performed. Patient was placed in dorsolithotomy in [place] stirrups. She was prepped and draped in the usual sterile fashion. A Red Rubber catheter was used to drain her bladder. A speculum was placeed in the vagina. The cervix was visualized anteriorly and grasped with a single-tooth tenaculum. Paracervical block was performed with 0.5% bupivicaine with 20 cc injected. The cervical polyp was grasped and twisted and removed from base. The hysteroscope was inserted and the endometrial cavity and inspected. The Myosure reach was used to resect the visualized polyps.  The hysteroscope was removed. Sharp curettage was performed in all 4 quadrants. All instruments were removed from the vagina. All instrument, needle and lap counts were  correct x2. The patient was awakened and is recovering in stable condition.   Lorriane Shire, MD Minimally Invasive Gynecologic Surgery  Obstetrics and Gynecology, [facility] for [facility], [facility] 03/15/2023

## 2023-03-15 NOTE — Anesthesia Procedure Notes (Signed)
Procedure Name: LMA Insertion Date/Time: 03/15/2023 8:58 AM  Performed by: Ammie Dalton, CRNAPre-anesthesia Checklist: Patient identified, Emergency Drugs available, Suction available and Patient being monitored Patient Re-evaluated:Patient Re-evaluated prior to induction Oxygen Delivery Method: Circle System Utilized Preoxygenation: Pre-oxygenation with 100% oxygen Induction Type: IV induction Ventilation: Mask ventilation without difficulty LMA: LMA inserted LMA Size: 4.0 Number of attempts: 1 Placement Confirmation: positive ETCO2 Tube secured with: Tape Dental Injury: Teeth and Oropharynx as per pre-operative assessment

## 2023-03-15 NOTE — Anesthesia Postprocedure Evaluation (Signed)
Anesthesia Post Note  Patient: Audrey Montgomery  Procedure(s) Performed: DILATATION & CURETTAGE/HYSTEROSCOPY WITH MYOSURE (Uterus)     Patient location during evaluation: PACU Anesthesia Type: General Level of consciousness: awake and alert and oriented Pain management: pain level controlled Vital Signs Assessment: post-procedure vital signs reviewed and stable Respiratory status: spontaneous breathing, nonlabored ventilation and respiratory function stable Cardiovascular status: blood pressure returned to baseline and stable Postop Assessment: no apparent nausea or vomiting Anesthetic complications: no   No notable events documented.  Last Vitals:  Vitals:   03/15/23 0945 03/15/23 1000  BP: 110/74 104/68  Pulse: 60 65  Resp: 16 15  Temp:  (!) 36.3 C  SpO2: 97% 97%    Last Pain:  Vitals:   03/15/23 1000  TempSrc:   PainSc: 0-No pain                 Nell Gales A.

## 2023-03-15 NOTE — Brief Op Note (Signed)
03/15/2023  9:20 AM  PATIENT:  Audrey Montgomery  63 y.o. female  PRE-OPERATIVE DIAGNOSIS:  Postcoital bleeding Polyp  POST-OPERATIVE DIAGNOSIS:  Postcoital bleedingPolyp  PROCEDURE:  Procedure(s): DILATATION & CURETTAGE/HYSTEROSCOPY WITH MYOSURE (N/A)  SURGEON:  Surgeons and Role:    Lorriane Shire, MD - Primary  PHYSICIAN ASSISTANT:   ASSISTANTS: none   ANESTHESIA:   general and paracervical block  EBL:  5 mL   BLOOD ADMINISTERED:none  DRAINS: none   LOCAL MEDICATIONS USED:  BUPIVICAINE   SPECIMEN:  Source of Specimen:  cervical poly, endometrial polyp with curettings  DISPOSITION OF SPECIMEN:  PATHOLOGY  COUNTS:  YES  TOURNIQUET:  * No tourniquets in log *  DICTATION: .Note written in EPIC  PLAN OF CARE: Discharge to home after PACU  PATIENT DISPOSITION:  PACU - hemodynamically stable.   Delay start of Pharmacological VTE agent (>24hrs) due to surgical blood loss or risk of bleeding: not applicable

## 2023-03-15 NOTE — Transfer of Care (Signed)
Immediate Anesthesia Transfer of Care Note  Patient: Audrey Montgomery  Procedure(s) Performed: DILATATION & CURETTAGE/HYSTEROSCOPY WITH MYOSURE (Uterus)  Patient Location: PACU  Anesthesia Type:General  Level of Consciousness: awake, alert , oriented, and patient cooperative  Airway & Oxygen Therapy: Patient Spontanous Breathing and Patient connected to nasal cannula oxygen  Post-op Assessment: Report given to RN and Post -op Vital signs reviewed and stable  Post vital signs: Reviewed and stable  Last Vitals:  Vitals Value Taken Time  BP    Temp    Pulse 70 03/15/23 0932  Resp 13 03/15/23 0932  SpO2 97 % 03/15/23 0932  Vitals shown include unfiled device data.  Last Pain:  Vitals:   03/15/23 0843  TempSrc:   PainSc: 0-No pain         Complications: No notable events documented.

## 2023-03-15 NOTE — Discharge Instructions (Signed)
Post-surgical Instructions, Outpatient Surgery  You may expect to feel dizzy, weak, and drowsy for as long as 24 hours after receiving the medicine that made you sleep (anesthetic). For the first 24 hours after your surgery:   Do not drive a car, ride a bicycle, participate in physical activities, or take public transportation until you are done taking narcotic pain medicines or as directed by Dr. Briscoe Deutscher.  Do not drink alcohol or take tranquilizers.  Do not take medicine that has not been prescribed by your physicians.  Do not sign important papers or make important decisions while on narcotic pain medicines.  Have a responsible person with you.    PAIN MANAGEMENT Ibuprofen 800mg .  (This is the same as 4-200mg  over the counter tablets of Motrin or ibuprofen.)  Take this every 6 hours or as needed for cramping.   Acetaminophen 1000mg  (This is the same as 2-500mg  over the counter extra strength tylenol). Take this every 6 hours for the first 3 days or as needed afterwards for pain  DO'S AND DON'T'S Do not take a tub bath for 2 weeks.  You may shower on the first day after your surgery Do not do any heavy lifting for one to two weeks.  This increases the chance of bleeding. Do move around as you feel able.  Stairs are fine.  You may begin to exercise again as you feel able.  Do not lift any weights for two weeks. Do not put anything in the vagina for two weeks--no tampons, intercourse, or douching.    REGULAR MEDIATIONS/VITAMINS: You may restart all of your regular medications as prescribed. You may restart all of your vitamins as you normally take them.    PLEASE CALL OR SEEK MEDICAL CARE IF: You have persistent nausea and vomiting.  You have trouble eating or drinking.  You have an oral temperature above 100.5.  You have constipation that is not helped by adjusting diet or increasing fluid intake. Pain medicines are a common cause of constipation.  You have heavy vaginal bleeding You  have redness or drainage from your incision(s) or there is increasing pain or tenderness near or in the surgical site.

## 2023-03-15 NOTE — H&P (Signed)
OB/GYN Pre-Op History and Physical  Audrey Montgomery is a 63 y.o. G5P5000 presenting for surgical management of polyp found during workup for postcoital bleeding.       Past Medical History:  Diagnosis Date   Asthma    Cataract    Both Eyes.   Hypertension     Past Surgical History:  Procedure Laterality Date   APPENDECTOMY     CATARACT EXTRACTION Bilateral    02/24    OB History  Gravida Para Term Preterm AB Living  5 5 5      SAB IAB Ectopic Multiple Live Births          # Outcome Date GA Lbr Len/2nd Weight Sex Type Anes PTL Lv  5 Term 09/1992 [redacted]w[redacted]d    Vag-Spont     4 Term 10/1989 [redacted]w[redacted]d    Vag-Spont     3 Term 02/1988 [redacted]w[redacted]d    Vag-Spont     2 Term 11/1979 [redacted]w[redacted]d    Vag-Spont     1 Term 04/1978 [redacted]w[redacted]d    Vag-Spont       Social History   Socioeconomic History   Marital status: Widowed    Spouse name: Not on file   Number of children: 5   Years of education: Not on file   Highest education level: Some college, no degree  Occupational History   Not on file  Tobacco Use   Smoking status: Former    Average packs/day: 0.5 packs/day for 50.2 years (25.1 ttl pk-yrs)    Types: Cigarettes    Start date: 1974   Smokeless tobacco: Never   Tobacco comments:    Stopped smoking 3 months from 08/24/22  Vaping Use   Vaping status: Never Used  Substance and Sexual Activity   Alcohol use: Not Currently   Drug use: No   Sexual activity: Yes  Other Topics Concern   Not on file  Social History Narrative   Lives at home with her brother. He had a stroke and she cares for him.   Right handed   Social Drivers of Health   Financial Resource Strain: Medium Risk (02/26/2023)   Overall Financial Resource Strain (CARDIA)    Difficulty of Paying Living Expenses: Somewhat hard  Food Insecurity: Food Insecurity Present (02/26/2023)   Hunger Vital Sign    Worried About Running Out of Food in the Last Year: Sometimes true    Ran Out of Food in the Last Year: Sometimes true   Transportation Needs: No Transportation Needs (02/26/2023)   PRAPARE - Administrator, Civil Service (Medical): No    Lack of Transportation (Non-Medical): No  Physical Activity: Insufficiently Active (02/26/2023)   Exercise Vital Sign    Days of Exercise per Week: 4 days    Minutes of Exercise per Session: 10 min  Stress: No Stress Concern Present (02/26/2023)   [employer] of Occupational Health - Occupational Stress Questionnaire    Feeling of Stress : Not at all  Social Connections: Socially Isolated (02/26/2023)   Social Connection and Isolation Panel [NHANES]    Frequency of Communication with Friends and Family: Twice a week    Frequency of Social Gatherings with Friends and Family: Never    Attends Religious Services: Never    Database administrator or Organizations: No    Attends Engineer, structural: Not on file    Marital Status: Widowed    Family History  Problem Relation Age of Onset   Stroke Brother  Peripheral vascular disease Other        father's side   Neuropathy Neg Hx        that she knows of    Breast cancer Neg Hx     Medications Prior to Admission  Medication Sig Dispense Refill Last Dose/Taking   amLODipine (NORVASC) 10 MG tablet Take 1 tablet (10 mg total) by mouth daily. 90 tablet 1    atorvastatin (LIPITOR) 20 MG tablet Take 1 tablet (20 mg total) by mouth daily. 90 tablet 3    clotrimazole (LOTRIMIN) 1 % cream Apply 1 Application topically 2 (two) times daily. For underarm rash (Patient not taking: Reported on 03/09/2023) 30 g 0 Not Taking   fluticasone (FLONASE) 50 MCG/ACT nasal spray Place 2 sprays into both nostrils daily. 16 g 6    gabapentin (NEURONTIN) 600 MG tablet TAKE 1/2 TO 1 (ONE-HALF TO ONE) TABLET BY MOUTH THREE TIMES DAILY (Patient taking differently: Take 600 mg by mouth 2 (two) times daily.) 90 tablet 0    lisinopril (ZESTRIL) 20 MG tablet Take 1 tablet (20 mg total) by mouth daily. 90 tablet 1    loratadine  (CLARITIN) 10 MG tablet Take 1 tablet (10 mg total) by mouth daily. (Patient not taking: Reported on 03/09/2023) 90 tablet 3 Not Taking   varenicline (CHANTIX) 1 MG tablet Take 1 tablet (1 mg total) by mouth 2 (two) times daily. (Patient not taking: Reported on 03/09/2023) 180 tablet 0 Not Taking   VENTOLIN HFA 108 (90 Base) MCG/ACT inhaler INHALE 2 PUFFS BY MOUTH EVERY 6 HOURS AS NEEDED FOR WHEEZING FOR SHORTNESS OF BREATH 18 g 2     No Known Allergies  Review of Systems: Negative except for what is mentioned in HPI.     Physical Exam: Ht 5\' 9"  (1.753 m)   Wt 98.4 kg   LMP  (LMP Unknown)   BMI 32.05 kg/m  CONSTITUTIONAL: Well-developed, well-nourished and in no acute distress.  HENT:  Normocephalic, atraumatic, External right and left ear normal. Oropharynx is clear and moist EYES: Conjunctivae and EOM are normal. Pupils are equal, round, and reactive to light. No scleral icterus.  NECK: Normal range of motion, supple, no masses SKIN: Skin is warm and dry. No rash noted. Not diaphoretic. No erythema. No pallor. NEUROLGIC: Alert and oriented to person, place, and time. Normal reflexes, muscle tone coordination. No cranial nerve deficit noted. PSYCHIATRIC: Normal mood and affect. Normal behavior. Normal judgment and thought content. RESPIRATORY: Normal effort PELVIC: Deferred   Pertinent Labs/Studies:   No results found for this or any previous visit (from the past 72 hours).     Assessment and Plan :Audrey Montgomery is a 63 y.o. G5P5000 here for hysteroscopic polypectomy for postcoital bleeding.   Patient desires surgical management with hsc polypectomy.  The risks of surgery were discussed in detail with the patient including but not limited to: bleeding which may require transfusion or reoperation; infection which may require prolonged hospitalization or re-hospitalization and antibiotic therapy; injury to bowel, bladder, ureters and major vessels or other surrounding organs  which may lead to other procedures; formation of adhesions; need for additional procedures including laparotomy or subsequent procedures secondary to intraoperative injury or abnormal pathology; thromboembolic phenomenon; incisional problems and other postoperative or anesthesia complications.  Patient was told that the likelihood that her condition and symptoms will be treated effectively with this surgical management was high; the postoperative expectations were also discussed in detail. The patient also understands the alternative treatment  options which were discussed in full. All questions were answered.    Lorriane Shire, M.D. Minimally Invasive Gynecologic Surgery and Pelvic Pain Specialist Attending Obstetrician & Gynecologist, Faculty Practice Center for [employer], [facility]

## 2023-03-16 ENCOUNTER — Encounter (HOSPITAL_COMMUNITY): Payer: Self-pay | Admitting: Obstetrics and Gynecology

## 2023-03-16 LAB — SURGICAL PATHOLOGY

## 2023-03-17 ENCOUNTER — Encounter: Payer: Self-pay | Admitting: Obstetrics and Gynecology

## 2023-03-30 ENCOUNTER — Other Ambulatory Visit: Payer: Self-pay | Admitting: Nurse Practitioner

## 2023-03-30 DIAGNOSIS — M792 Neuralgia and neuritis, unspecified: Secondary | ICD-10-CM

## 2023-04-14 ENCOUNTER — Telehealth (INDEPENDENT_AMBULATORY_CARE_PROVIDER_SITE_OTHER): Payer: Medicaid Other | Admitting: Obstetrics and Gynecology

## 2023-04-14 VITALS — Ht 68.0 in | Wt 207.0 lb

## 2023-04-14 DIAGNOSIS — Z09 Encounter for follow-up examination after completed treatment for conditions other than malignant neoplasm: Secondary | ICD-10-CM

## 2023-04-14 DIAGNOSIS — N930 Postcoital and contact bleeding: Secondary | ICD-10-CM

## 2023-04-14 DIAGNOSIS — N840 Polyp of corpus uteri: Secondary | ICD-10-CM

## 2023-04-14 NOTE — Progress Notes (Signed)
 GYNECOLOGY VIRTUAL VISIT ENCOUNTER NOTE  Provider location: Center for [facility]   Patient location: Home  I connected with Audrey Montgomery on 04/14/23 at  9:15 AM EST by MyChart Video Encounter and verified that I am speaking with the correct person using two identifiers.   I discussed the limitations, risks, security and privacy concerns of performing an evaluation and management service virtually and the availability of in person appointments. I also discussed with the patient that there may be a patient responsible charge related to this service. The patient expressed understanding and agreed to proceed.   History:  Audrey Montgomery is a 63 y.o. G54P5000 female being evaluated today for postop follow up. Has been doing well since procedure. Not having any bleeding. Not having pain. No problems with BM or voiding.      Past Medical History:  Diagnosis Date   Asthma    Cataract    Both Eyes.   Hypertension    Past Surgical History:  Procedure Laterality Date   APPENDECTOMY     CATARACT EXTRACTION Bilateral    02/24   DILATATION & CURETTAGE/HYSTEROSCOPY WITH MYOSURE N/A 03/15/2023   Procedure: DILATATION & CURETTAGE/HYSTEROSCOPY WITH MYOSURE;  Surgeon: Lorriane Shire, MD;  Location: [facility] OR;  Service: Gynecology;  Laterality: N/A;   The following portions of the patient's history were reviewed and updated as appropriate: allergies, current medications, past family history, past medical history, past social history, past surgical history and problem list.   Health Maintenance:      Component Value Date/Time   DIAGPAP  08/24/2022 0929    - Negative for intraepithelial lesion or malignancy (NILM)   HPVHIGH Negative 08/24/2022 0929   ADEQPAP  08/24/2022 0929    Satisfactory for evaluation; transformation zone component PRESENT.    High Risk HPV: Positive  Adequacy:  Satisfactory for evaluation, transformation zone component  PRESENT  Diagnosis:  Atypical squamous cells of undetermined significance (ASC-US)   Review of Systems:  Pertinent items noted in HPI and remainder of comprehensive ROS otherwise negative.  Physical Exam:   General:  Alert, oriented and cooperative. Patient appears to be in no acute distress.  Mental Status: Normal mood and affect. Normal behavior. Normal judgment and thought content.   Respiratory: Normal respiratory effort, no problems with respiration noted  Rest of physical exam deferred due to type of encounter  Labs and Imaging FINAL MICROSCOPIC DIAGNOSIS:   A. ENDOMETRIUM, CURETTAGE:       Benign endometrial polyp.       Negative for hyperplasia, atypia or malignancy.   B. CERVIX, POLYPECTOMY:       Benign endocervical polyp.       Negative for hyperplasia, atypia or malignancy.    Assessment and Plan:     1. Postop check (Primary) Doing well and meeting postop goals. Reviewed pathology.   2. Endometrial polyp 3. Postcoital bleeding Resolved s/p uncomplicated hysteroscopy       I discussed the assessment and treatment plan with the patient. The patient was provided an opportunity to ask questions and all were answered. The patient agreed with the plan and demonstrated an understanding of the instructions.   The patient was advised to call back or seek an in-person evaluation/go to the ED if the symptoms worsen or if the condition fails to improve as anticipated.  I provided 5 minutes of face-to-face time during this encounter. I also spent 5 minutes dedicated to the care of this patient  including pre-visit review of records, post visit ordering of medications and appropriate tests or procedures, coordinating care and documenting this visit encounter.    Lorriane Shire, MD Center for [employer], [facility]

## 2023-05-05 ENCOUNTER — Ambulatory Visit: Admitting: Physician Assistant

## 2023-05-07 ENCOUNTER — Ambulatory Visit: Payer: Self-pay | Attending: Nurse Practitioner | Admitting: Nurse Practitioner

## 2023-05-07 ENCOUNTER — Encounter: Payer: Self-pay | Admitting: Nurse Practitioner

## 2023-05-07 ENCOUNTER — Other Ambulatory Visit: Payer: Self-pay | Admitting: Nurse Practitioner

## 2023-05-07 VITALS — BP 103/71 | HR 76 | Resp 19 | Ht 68.0 in | Wt 211.2 lb

## 2023-05-07 DIAGNOSIS — F5104 Psychophysiologic insomnia: Secondary | ICD-10-CM

## 2023-05-07 DIAGNOSIS — F17200 Nicotine dependence, unspecified, uncomplicated: Secondary | ICD-10-CM

## 2023-05-07 DIAGNOSIS — Z0289 Encounter for other administrative examinations: Secondary | ICD-10-CM

## 2023-05-07 DIAGNOSIS — I10 Essential (primary) hypertension: Secondary | ICD-10-CM | POA: Diagnosis not present

## 2023-05-07 DIAGNOSIS — Z1211 Encounter for screening for malignant neoplasm of colon: Secondary | ICD-10-CM

## 2023-05-07 MED ORDER — VARENICLINE TARTRATE 1 MG PO TABS: 1.0000 mg | ORAL_TABLET | Freq: Two times a day (BID) | ORAL | 0 refills | Status: DC

## 2023-05-07 MED ORDER — LISINOPRIL 20 MG PO TABS: 20.0000 mg | ORAL_TABLET | Freq: Every day | ORAL | 1 refills | Status: DC

## 2023-05-07 MED ORDER — TRAZODONE HCL 100 MG PO TABS: 100.0000 mg | ORAL_TABLET | Freq: Every day | ORAL | 0 refills | Status: DC

## 2023-05-07 MED ORDER — AMLODIPINE BESYLATE 10 MG PO TABS: 10.0000 mg | ORAL_TABLET | Freq: Every day | ORAL | 1 refills | Status: DC

## 2023-05-07 NOTE — Progress Notes (Signed)
 Assessment & Plan:  Favour was seen today for medical management of chronic issues.  Diagnoses and all orders for this visit:  Essential hypertension -     amLODipine (NORVASC) 10 MG tablet; Take 1 tablet (10 mg total) by mouth daily. -     lisinopril (ZESTRIL) 20 MG tablet; Take 1 tablet (20 mg total) by mouth daily. Continue all antihypertensives as prescribed.  Reminded to bring in blood pressure log for follow  up appointment.  RECOMMENDATIONS: DASH/Mediterranean Diets are healthier choices for HTN.    Psychophysiological insomnia -     traZODone (DESYREL) 100 MG tablet; Take 1-2 tablets (100-200 mg total) by mouth at bedtime.  Colon cancer screening -     Ambulatory referral to Gastroenterology  Encounter for completion of form with patient Form completed, signed and given to Ms. Barfuss today   Patient has been counseled on age-appropriate routine health concerns for screening and prevention. These are reviewed and up-to-date. Referrals have been placed accordingly. Immunizations are up-to-date or declined.    Subjective:   Chief Complaint  Patient presents with   Medical Management of Chronic Issues    Audrey Montgomery 63 y.o. female presents to office today requesting forms to be filled out for foster parenting.  She lost her son a few weeks ago due to gun violence. She is visibly upset today and tearful. She does have family support. Hasn't been sleeping well at night since his death.  Declines psychotherapy at this time.   HTN Blood pressure is well controlled. BP Readings from Last 3 Encounters:  05/07/23 103/71  03/15/23 104/68  03/02/23 116/88    Review of Systems  Constitutional:  Negative for fever, malaise/fatigue and weight loss.  HENT: Negative.  Negative for nosebleeds.   Eyes: Negative.  Negative for blurred vision, double vision and photophobia.  Respiratory: Negative.  Negative for cough and shortness of breath.   Cardiovascular:  Negative.  Negative for chest pain, palpitations and leg swelling.  Gastrointestinal: Negative.  Negative for heartburn, nausea and vomiting.  Musculoskeletal: Negative.  Negative for myalgias.  Neurological: Negative.  Negative for dizziness, focal weakness, seizures and headaches.  Psychiatric/Behavioral:  Positive for depression. Negative for suicidal ideas.     Past Medical History:  Diagnosis Date   Asthma    Cataract    Both Eyes.   Hypertension     Past Surgical History:  Procedure Laterality Date   APPENDECTOMY     CATARACT EXTRACTION Bilateral    02/24   DILATATION & CURETTAGE/HYSTEROSCOPY WITH MYOSURE N/A 03/15/2023   Procedure: DILATATION & CURETTAGE/HYSTEROSCOPY WITH MYOSURE;  Surgeon: Lorriane Shire, MD;  Location: [facility] OR;  Service: Gynecology;  Laterality: N/A;    Family History  Problem Relation Age of Onset   Stroke Brother    Peripheral vascular disease Other        father's side   Neuropathy Neg Hx        that she knows of    Breast cancer Neg Hx     Social History Reviewed with no changes to be made today.   Outpatient Medications Prior to Visit  Medication Sig Dispense Refill   atorvastatin (LIPITOR) 20 MG tablet Take 1 tablet (20 mg total) by mouth daily. 90 tablet 3   fluticasone (FLONASE) 50 MCG/ACT nasal spray Place 2 sprays into both nostrils daily. 16 g 6   gabapentin (NEURONTIN) 600 MG tablet TAKE 1/2 TO 1 (ONE-HALF TO ONE) TABLET BY MOUTH THREE TIMES DAILY (Patient  taking differently: Take 600 mg by mouth 2 (two) times daily.) 90 tablet 0   loratadine (CLARITIN) 10 MG tablet Take 1 tablet (10 mg total) by mouth daily. 90 tablet 3   VENTOLIN HFA 108 (90 Base) MCG/ACT inhaler INHALE 2 PUFFS BY MOUTH EVERY 6 HOURS AS NEEDED FOR WHEEZING FOR SHORTNESS OF BREATH 18 g 2   amLODipine (NORVASC) 10 MG tablet Take 1 tablet (10 mg total) by mouth daily. 90 tablet 1   lisinopril (ZESTRIL) 20 MG tablet Take 1 tablet (20 mg total) by mouth daily. 90 tablet 1    clotrimazole (LOTRIMIN) 1 % cream Apply 1 Application topically 2 (two) times daily. For underarm rash (Patient not taking: Reported on 05/07/2023) 30 g 0   varenicline (CHANTIX) 1 MG tablet Take 1 tablet (1 mg total) by mouth 2 (two) times daily. (Patient not taking: Reported on 05/07/2023) 180 tablet 0   No facility-administered medications prior to visit.    No Known Allergies     Objective:    BP 103/71 (BP Location: Left Arm, Patient Position: Sitting, Cuff Size: Normal)   Pulse 76   Resp 19   Ht 5\' 8"  (1.727 m)   Wt 211 lb 3.2 oz (95.8 kg)   LMP  (LMP Unknown)   SpO2 97%   BMI 32.11 kg/m  Wt Readings from Last 3 Encounters:  05/07/23 211 lb 3.2 oz (95.8 kg)  04/14/23 207 lb (93.9 kg)  03/15/23 217 lb (98.4 kg)    Physical Exam Vitals and nursing note reviewed.  Constitutional:      Appearance: She is well-developed.  HENT:     Head: Normocephalic and atraumatic.  Cardiovascular:     Rate and Rhythm: Normal rate and regular rhythm.     Heart sounds: Normal heart sounds. No murmur heard.    No friction rub. No gallop.  Pulmonary:     Effort: Pulmonary effort is normal. No tachypnea or respiratory distress.     Breath sounds: Normal breath sounds. No decreased breath sounds, wheezing, rhonchi or rales.  Chest:     Chest wall: No tenderness.  Abdominal:     General: Bowel sounds are normal.     Palpations: Abdomen is soft.  Musculoskeletal:        General: Normal range of motion.     Cervical back: Normal range of motion.  Skin:    General: Skin is warm and dry.  Neurological:     Mental Status: She is alert and oriented to person, place, and time.     Coordination: Coordination normal.  Psychiatric:        Behavior: Behavior normal. Behavior is cooperative.        Thought Content: Thought content normal.        Judgment: Judgment normal.          Patient has been counseled extensively about nutrition and exercise as well as the importance of adherence  with medications and regular follow-up. The patient was given clear instructions to go to ER or return to medical center if symptoms don't improve, worsen or new problems develop. The patient verbalized understanding.   Follow-up: Return in about 3 months (around 08/07/2023), or if symptoms worsen or fail to improve.   Claiborne Rigg, FNP-BC [facility] and [facility] [place], [place] [phone]   05/07/2023, 11:13 PM

## 2023-06-01 ENCOUNTER — Ambulatory Visit: Payer: Self-pay | Admitting: Nurse Practitioner

## 2023-07-12 ENCOUNTER — Encounter: Payer: Self-pay | Admitting: Nurse Practitioner

## 2023-07-12 ENCOUNTER — Ambulatory Visit: Payer: Self-pay | Attending: Nurse Practitioner | Admitting: Nurse Practitioner

## 2023-07-12 ENCOUNTER — Other Ambulatory Visit (HOSPITAL_COMMUNITY)
Admission: RE | Admit: 2023-07-12 | Discharge: 2023-07-12 | Disposition: A | Discharge status: Home / Self Care | Source: Ambulatory Visit | Attending: Nurse Practitioner | Admitting: Nurse Practitioner

## 2023-07-12 ENCOUNTER — Other Ambulatory Visit: Payer: Self-pay

## 2023-07-12 VITALS — BP 122/80 | HR 66 | Resp 19 | Ht 69.6 in | Wt 208.6 lb

## 2023-07-12 DIAGNOSIS — R232 Flushing: Secondary | ICD-10-CM | POA: Diagnosis not present

## 2023-07-12 DIAGNOSIS — I10 Essential (primary) hypertension: Secondary | ICD-10-CM

## 2023-07-12 DIAGNOSIS — R0789 Other chest pain: Secondary | ICD-10-CM

## 2023-07-12 DIAGNOSIS — N760 Acute vaginitis: Secondary | ICD-10-CM

## 2023-07-12 NOTE — Patient Instructions (Signed)
 Address: [address] [place], [place], [place] [address] Hours:  Open ? Closes 5?PM Phone: [phone]

## 2023-07-12 NOTE — Progress Notes (Signed)
 Assessment & Plan:  [name] was seen today for hypertension.  Diagnoses and all orders for this visit:  Essential hypertension Continue all antihypertensives as prescribed.  Reminded to bring in blood pressure log for follow  up appointment.  RECOMMENDATIONS: DASH/Mediterranean Diets are healthier choices for HTN.     Atypical chest pain -     EKG 12-Lead -     Ambulatory referral to Cardiology  Acute vaginitis -     Cervicovaginal ancillary only  Hot flashes -     Ambulatory referral to Gynecology    Patient has been counseled on age-appropriate routine health concerns for screening and prevention. These are reviewed and up-to-date. Referrals have been placed accordingly. Immunizations are up-to-date or declined.    Subjective:   Chief Complaint  Patient presents with   Hypertension    Audrey Montgomery 63 y.o. female presents to office today for follow-up to hypertension.   HTN Blood pressure is well-controlled with amlodipine  10 mg daily and lisinopril  20 mg daily. BP Readings from Last 3 Encounters:  07/12/23 122/80  05/07/23 103/71  03/15/23 104/68     Vaginitis She feels her ph balance is off some days. Malodorous discharge. Started experiencing symptom after polyps were removed. Also having hot flashes and would like something to take for these. I will refer her back to GYN  Chest Pain: Patient complains of chest pain. Onset was several  weeks ago, with unchanged course since that time. The patient describes the pain as intermittent, awakening patient from sleep, pressure like, sharp, and substernal in nature, radiates to the left arm.  Associated symptoms are chest pressure/discomfort, dyspnea, and exertional chest pressure/discomfort. Aggravating factors are none.  Alleviating factors are: none. Patient's cardiac risk factors are hypertension and family history of stroke, and previous personal tobacco use.  Patient's risk factors for DVT/PE: none. Previous  cardiac testing: electrocardiogram (ECG).  She does not consume sodas anymore.    Review of Systems  Constitutional:  Negative for fever, malaise/fatigue and weight loss.       HOT FLASHES  HENT: Negative.  Negative for nosebleeds.   Eyes: Negative.  Negative for blurred vision, double vision and photophobia.  Respiratory:  Positive for shortness of breath. Negative for cough.   Cardiovascular:  Positive for chest pain. Negative for palpitations and leg swelling.  Gastrointestinal: Negative.  Negative for heartburn, nausea and vomiting.  Genitourinary:        SEE HPI  Musculoskeletal: Negative.  Negative for myalgias.  Neurological: Negative.  Negative for dizziness, focal weakness, seizures and headaches.  Psychiatric/Behavioral: Negative.  Negative for suicidal ideas.     Past Medical History:  Diagnosis Date   Asthma    Cataract    Both Eyes.   Hypertension     Past Surgical History:  Procedure Laterality Date   APPENDECTOMY     CATARACT EXTRACTION Bilateral    02/24   DILATATION & CURETTAGE/HYSTEROSCOPY WITH MYOSURE N/A 03/15/2023   Procedure: DILATATION & CURETTAGE/HYSTEROSCOPY WITH MYOSURE;  Surgeon: Kiki Pelton, MD;  Location: [facility] OR;  Service: Gynecology;  Laterality: N/A;    Family History  Problem Relation Age of Onset   Stroke Brother    Peripheral vascular disease Other        father's side   Neuropathy Neg Hx        that she knows of    Breast cancer Neg Hx     Social History Reviewed with no changes to be made today.  Outpatient Medications Prior to Visit  Medication Sig Dispense Refill   amLODipine  (NORVASC ) 10 MG tablet Take 1 tablet (10 mg total) by mouth daily. 90 tablet 1   atorvastatin  (LIPITOR) 20 MG tablet Take 1 tablet (20 mg total) by mouth daily. 90 tablet 3   fluticasone  (FLONASE ) 50 MCG/ACT nasal spray Place 2 sprays into both nostrils daily. 16 g 6   gabapentin  (NEURONTIN ) 600 MG tablet TAKE 1/2 TO 1 (ONE-HALF TO ONE) TABLET BY  MOUTH THREE TIMES DAILY (Patient taking differently: Take 600 mg by mouth 2 (two) times daily.) 90 tablet 0   lisinopril  (ZESTRIL ) 20 MG tablet Take 1 tablet (20 mg total) by mouth daily. 90 tablet 1   loratadine  (CLARITIN ) 10 MG tablet Take 1 tablet (10 mg total) by mouth daily. 90 tablet 3   traZODone  (DESYREL ) 100 MG tablet Take 1-2 tablets (100-200 mg total) by mouth at bedtime. 180 tablet 0   varenicline  (CHANTIX ) 1 MG tablet Take 1 tablet (1 mg total) by mouth 2 (two) times daily. 180 tablet 0   VENTOLIN  HFA 108 (90 Base) MCG/ACT inhaler INHALE 2 PUFFS BY MOUTH EVERY 6 HOURS AS NEEDED FOR WHEEZING FOR SHORTNESS OF BREATH 18 g 2   clotrimazole  (LOTRIMIN ) 1 % cream Apply 1 Application topically 2 (two) times daily. For underarm rash (Patient not taking: Reported on 05/07/2023) 30 g 0   No facility-administered medications prior to visit.    No Known Allergies     Objective:    BP 122/80 (BP Location: Left Arm, Patient Position: Sitting, Cuff Size: Normal)   Pulse 66   Resp 19   Ht 5' 9.6" (1.768 m)   Wt 208 lb 9.6 oz (94.6 kg)   LMP  (LMP Unknown)   SpO2 99%   BMI 30.28 kg/m  Wt Readings from Last 3 Encounters:  07/12/23 208 lb 9.6 oz (94.6 kg)  05/07/23 211 lb 3.2 oz (95.8 kg)  04/14/23 207 lb (93.9 kg)    Physical Exam Vitals and nursing note reviewed.  Constitutional:      Appearance: She is well-developed.  HENT:     Head: Normocephalic and atraumatic.  Cardiovascular:     Rate and Rhythm: Normal rate and regular rhythm.     Heart sounds: Normal heart sounds. No murmur heard.    No friction rub. No gallop.  Pulmonary:     Effort: Pulmonary effort is normal. No tachypnea or respiratory distress.     Breath sounds: Normal breath sounds. No decreased breath sounds, wheezing, rhonchi or rales.  Chest:     Chest wall: No tenderness.  Abdominal:     General: Bowel sounds are normal.     Palpations: Abdomen is soft.  Musculoskeletal:        General: Normal range of  motion.     Cervical back: Normal range of motion.  Skin:    General: Skin is warm and dry.  Neurological:     Mental Status: She is alert and oriented to person, place, and time.     Coordination: Coordination normal.  Psychiatric:        Behavior: Behavior normal. Behavior is cooperative.        Thought Content: Thought content normal.        Judgment: Judgment normal.          Patient has been counseled extensively about nutrition and exercise as well as the importance of adherence with medications and regular follow-up. The patient was given clear instructions  to go to ER or return to medical center if symptoms don't improve, worsen or new problems develop. The patient verbalized understanding.   Follow-up: Return in about 4 months (around 11/11/2023).   Collins Dean, FNP-BC [facility] [place], [place] [phone]   07/12/2023, 11:55 AM

## 2023-07-13 ENCOUNTER — Ambulatory Visit: Payer: Self-pay | Admitting: Nurse Practitioner

## 2023-07-13 ENCOUNTER — Other Ambulatory Visit: Payer: Self-pay | Admitting: Nurse Practitioner

## 2023-07-13 DIAGNOSIS — N760 Acute vaginitis: Secondary | ICD-10-CM

## 2023-07-13 LAB — CERVICOVAGINAL ANCILLARY ONLY
Bacterial Vaginitis (gardnerella): POSITIVE — AB
Candida Glabrata: NEGATIVE
Candida Vaginitis: POSITIVE — AB
Chlamydia: NEGATIVE
Comment: NEGATIVE
Comment: NEGATIVE
Comment: NEGATIVE
Comment: NEGATIVE
Comment: NEGATIVE
Comment: NORMAL
Neisseria Gonorrhea: NEGATIVE
Trichomonas: NEGATIVE

## 2023-07-13 MED ORDER — FLUCONAZOLE 150 MG PO TABS: 150.0000 mg | ORAL_TABLET | Freq: Once | ORAL | 0 refills | Status: AC

## 2023-07-13 MED ORDER — METRONIDAZOLE 500 MG PO TABS: 500.0000 mg | ORAL_TABLET | Freq: Two times a day (BID) | ORAL | 0 refills | Status: AC

## 2023-07-31 ENCOUNTER — Other Ambulatory Visit: Payer: Self-pay | Admitting: Nurse Practitioner

## 2023-07-31 DIAGNOSIS — F5104 Psychophysiologic insomnia: Secondary | ICD-10-CM

## 2023-11-10 ENCOUNTER — Telehealth: Payer: Self-pay | Admitting: Nurse Practitioner

## 2023-11-10 NOTE — Telephone Encounter (Signed)
 Pt confirmed appt 10/1 (per vr )

## 2023-11-12 ENCOUNTER — Ambulatory Visit: Attending: Nurse Practitioner | Admitting: Nurse Practitioner

## 2023-11-12 ENCOUNTER — Encounter: Payer: Self-pay | Admitting: Nurse Practitioner

## 2023-11-12 ENCOUNTER — Other Ambulatory Visit: Payer: Self-pay | Admitting: Nurse Practitioner

## 2023-11-12 ENCOUNTER — Telehealth: Payer: Self-pay | Admitting: Pharmacist

## 2023-11-12 VITALS — BP 117/81 | HR 67 | Resp 18 | Ht 69.6 in | Wt 198.2 lb

## 2023-11-12 DIAGNOSIS — R232 Flushing: Secondary | ICD-10-CM

## 2023-11-12 DIAGNOSIS — R6889 Other general symptoms and signs: Secondary | ICD-10-CM

## 2023-11-12 DIAGNOSIS — J019 Acute sinusitis, unspecified: Secondary | ICD-10-CM | POA: Diagnosis not present

## 2023-11-12 DIAGNOSIS — B9689 Other specified bacterial agents as the cause of diseases classified elsewhere: Secondary | ICD-10-CM

## 2023-11-12 DIAGNOSIS — I10 Essential (primary) hypertension: Secondary | ICD-10-CM

## 2023-11-12 LAB — POC COVID19/FLU A&B COMBO
Covid Antigen, POC: NEGATIVE
Influenza A Antigen, POC: NEGATIVE
Influenza B Antigen, POC: NEGATIVE

## 2023-11-12 MED ORDER — BENZONATATE 200 MG PO CAPS: 200.0000 mg | ORAL_CAPSULE | Freq: Two times a day (BID) | ORAL | 0 refills | Status: DC | PRN

## 2023-11-12 MED ORDER — VEOZAH 45 MG PO TABS: 45.0000 mg | ORAL_TABLET | Freq: Every day | ORAL | 1 refills | Status: AC

## 2023-11-12 MED ORDER — AMLODIPINE BESYLATE 10 MG PO TABS: 10.0000 mg | ORAL_TABLET | Freq: Every day | ORAL | 1 refills | Status: AC

## 2023-11-12 MED ORDER — LISINOPRIL 20 MG PO TABS: 20.0000 mg | ORAL_TABLET | Freq: Every day | ORAL | 1 refills | Status: AC

## 2023-11-12 MED ORDER — AMOXICILLIN-POT CLAVULANATE 875-125 MG PO TABS: 1.0000 | ORAL_TABLET | Freq: Two times a day (BID) | ORAL | 0 refills | Status: AC

## 2023-11-12 NOTE — Patient Instructions (Addendum)
 Sleepy time tea Ashwaghanda Melatonin 10 mg Zinc Elderberry Sleepy time tea

## 2023-11-12 NOTE — Progress Notes (Signed)
 Assessment & Plan:  Audrey Montgomery was seen today for sinus problem and cough.  Diagnoses and all orders for this visit:  Acute bacterial sinusitis -     amoxicillin -clavulanate (AUGMENTIN ) 875-125 MG tablet; Take 1 tablet by mouth 2 (two) times daily for 7 days. -     benzonatate  (TESSALON ) 200 MG capsule; Take 1 capsule (200 mg total) by mouth 2 (two) times daily as needed for cough. - COVID-19 test negative. - If COVID-19 test is negative, prescribe Augmentin . - Consider Tessalon  Perles or cough syrup for cough, depending on insurance coverage. - Advise use of zinc, elderberry, and vitamin D for immune support.   Primary hypertension -     amLODipine  (NORVASC ) 10 MG tablet; Take 1 tablet (10 mg total) by mouth daily. -     lisinopril  (ZESTRIL ) 20 MG tablet; Take 1 tablet (20 mg total) by mouth daily.  Flu-like symptoms -     POC Covid19/Flu A&B Antigen  Menopausal symptoms (hot flashes) Persistent hot flashes. - Consider prescribing Trivisc for hot flashes.  Patient has been counseled on age-appropriate routine health concerns for screening and prevention. These are reviewed and up-to-date. Referrals have been placed accordingly. Immunizations are up-to-date or declined.    Subjective:   Chief Complaint  Patient presents with   Sinus Problem   Cough    Phlegm noticed to be green/yellow in color. Symptoms started 1 week ago. OTC medication taken as well as Flonase  with no relief.      History of Present Illness Audrey Montgomery is a 63 year old female who presents with upper respiratory symptoms.  She has been experiencing upper respiratory symptoms since the beginning of the week, starting with a scratchy throat on Monday. Her mucus is sometimes yellow and sometimes green. She has been using over-the-counter cough drops for her throat, but they have not been effective.  Her symptoms have progressed to include a runny nose, which started today, and a persistent cough that  feels like she is going to choke. Her eyes run occasionally, but not consistently. No fever or chills. She has been using Flonase , which she has a prescription for, but it has not alleviated her symptoms. She also regularly performs warm saltwater gargles, even when not sick.  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  She mentions a previous referral to a gynecologist for sweating and hot flashes, which she found unsatisfactory and expresses dissatisfaction with the care she received, stating she does not wish to return to that provider.  Review of Systems  Constitutional:  Negative for fever, malaise/fatigue and weight loss.       Hot flashes  HENT: Negative.  Negative for nosebleeds.   Eyes: Negative.  Negative for blurred vision, double vision and photophobia.  Respiratory: Negative.  Negative for cough and shortness of breath.   Cardiovascular: Negative.  Negative for chest pain, palpitations and leg swelling.  Gastrointestinal: Negative.  Negative for heartburn, nausea and vomiting.  Musculoskeletal: Negative.  Negative for myalgias.  Neurological: Negative.  Negative for dizziness, focal weakness, seizures and headaches.  Psychiatric/Behavioral: Negative.  Negative for suicidal ideas.     Past Medical History:  Diagnosis Date   Asthma    Cataract    Both Eyes.   Hypertension     Past Surgical History:  Procedure Laterality Date   APPENDECTOMY  CATARACT EXTRACTION Bilateral    02/24   DILATATION & CURETTAGE/HYSTEROSCOPY WITH MYOSURE N/A 03/15/2023   Procedure: DILATATION & CURETTAGE/HYSTEROSCOPY WITH MYOSURE;  Surgeon: Jeralyn Crutch, MD;  Location: [facility] OR;  Service: Gynecology;  Laterality: N/A;    Family History  Problem Relation Age of Onset   Stroke Brother    Peripheral vascular disease Other        father's side    Neuropathy Neg Hx        that she knows of    Breast cancer Neg Hx     Social History Reviewed with no changes to be made today.   Outpatient Medications Prior to Visit  Medication Sig Dispense Refill   atorvastatin  (LIPITOR) 20 MG tablet Take 1 tablet (20 mg total) by mouth daily. 90 tablet 3   fluticasone  (FLONASE ) 50 MCG/ACT nasal spray Place 2 sprays into both nostrils daily. 16 g 6   gabapentin  (NEURONTIN ) 600 MG tablet TAKE 1/2 TO 1 (ONE-HALF TO ONE) TABLET BY MOUTH THREE TIMES DAILY (Patient taking differently: Take 600 mg by mouth 2 (two) times daily.) 90 tablet 0   VENTOLIN  HFA 108 (90 Base) MCG/ACT inhaler INHALE 2 PUFFS BY MOUTH EVERY 6 HOURS AS NEEDED FOR WHEEZING FOR SHORTNESS OF BREATH 18 g 2   amLODipine  (NORVASC ) 10 MG tablet Take 1 tablet (10 mg total) by mouth daily. 90 tablet 1   lisinopril  (ZESTRIL ) 20 MG tablet Take 1 tablet (20 mg total) by mouth daily. 90 tablet 1   varenicline  (CHANTIX ) 1 MG tablet Take 1 tablet (1 mg total) by mouth 2 (two) times daily. 180 tablet 0   loratadine  (CLARITIN ) 10 MG tablet Take 1 tablet (10 mg total) by mouth daily. 90 tablet 3   traZODone  (DESYREL ) 100 MG tablet TAKE 1 TO 2 TABLETS BY MOUTH AT BEDTIME 180 tablet 0   No facility-administered medications prior to visit.    No Known Allergies     Objective:    BP 117/81 (BP Location: Left Arm, Patient Position: Sitting, Cuff Size: Normal)   Pulse 67   Resp 18   Ht 5' 9.6 (1.768 m)   Wt 198 lb 3.2 oz (89.9 kg)   LMP  (LMP Unknown)   SpO2 98%   BMI 28.77 kg/m  Wt Readings from Last 3 Encounters:  11/12/23 198 lb 3.2 oz (89.9 kg)  07/12/23 208 lb 9.6 oz (94.6 kg)  05/07/23 211 lb 3.2 oz (95.8 kg)    Physical Exam Vitals and nursing note reviewed.  Constitutional:      Appearance: She is well-developed.  HENT:     Head: Normocephalic and atraumatic.     Nose:     Right Turbinates: Enlarged and swollen (erythematous).     Left Turbinates: Enlarged and swollen  (erythematous).     Mouth/Throat:     Pharynx: Oropharynx is clear. No pharyngeal swelling, posterior oropharyngeal erythema or postnasal drip.     Tonsils: 1+ on the right. 1+ on the left.  Cardiovascular:     Rate and Rhythm: Normal rate and regular rhythm.     Heart sounds: Normal heart sounds. No murmur heard.    No friction rub. No gallop.  Pulmonary:     Effort: Pulmonary effort is normal. No tachypnea or respiratory distress.     Breath sounds: Normal breath sounds. No decreased breath sounds, wheezing, rhonchi or rales.  Chest:     Chest wall: No tenderness.  Abdominal:     General: Bowel sounds  are normal.     Palpations: Abdomen is soft.  Musculoskeletal:        General: Normal range of motion.     Cervical back: Normal range of motion.  Skin:    General: Skin is warm and dry.  Neurological:     Mental Status: She is alert and oriented to person, place, and time.     Coordination: Coordination normal.  Psychiatric:        Behavior: Behavior normal. Behavior is cooperative.        Thought Content: Thought content normal.        Judgment: Judgment normal.          Patient has been counseled extensively about nutrition and exercise as well as the importance of adherence with medications and regular follow-up. The patient was given clear instructions to go to ER or return to medical center if symptoms don't improve, worsen or new problems develop. The patient verbalized understanding.   Follow-up: Return in about 3 months (around 02/12/2024).   Haze LELON Servant, FNP-BC [facility] [place], [place] [phone]   11/12/2023, 3:24 PM

## 2023-11-12 NOTE — Telephone Encounter (Signed)
 Are we able to try a PA for this patient's Veozah?

## 2023-11-15 ENCOUNTER — Telehealth: Payer: Self-pay

## 2023-11-15 ENCOUNTER — Other Ambulatory Visit: Payer: Self-pay

## 2023-11-15 NOTE — Telephone Encounter (Signed)
 Pharmacy Patient Advocate Encounter   Received notification from Physician's Office that prior authorization for [facility] is required/requested.   Insurance verification completed.   The patient is insured through [facility].   Per test claim: PA required; PA submitted to above mentioned insurance via Prompt PA Key/confirmation #/EOC [id number] Status is pending   Pharmacy Patient Advocate Encounter   Received notification from Physician's Office that prior authorization for [facility] is required/requested.   Insurance verification completed.   The patient is insured through [employer] (COMMERCIAL).   Per test claim: PA required; PA submitted to above mentioned insurance via CoverMyMeds Key/confirmation #/EOC [id number] Status is pending

## 2023-11-16 ENCOUNTER — Other Ambulatory Visit: Payer: Self-pay

## 2023-11-16 NOTE — Telephone Encounter (Signed)
 Please let her know the veozah for hot flashes was denied. They want her to have tried lexapro or celexa first. Would she like to try one of these?

## 2023-11-16 NOTE — Telephone Encounter (Signed)
 Pharmacy Patient Advocate Encounter  Received notification from [facility] [employer] (COMMERCIAL) that Prior Authorization for [facility] has been DENIED.  Full denial letter will be uploaded to the media tab. See denial reason below.  Patient must try 2 preferred alternatives-clonidine  on file, must also try citalopram or escitalopram  Pharmacy Patient Advocate Encounter  Received notification from [facility] that Prior Authorization for veozah has been DENIED.  Full denial letter will be uploaded to the media tab. See denial reason below.   PA #/Case ID/Reference #: [id number]  Trial and failure of 3 preferred drugs required-clonidine  on file, must also try paroxetine and venlafaxine

## 2023-11-17 NOTE — Telephone Encounter (Signed)
 Unable to reach patient by phone to review results.  Voicemail left to return call for results.

## 2023-11-17 NOTE — Telephone Encounter (Signed)
 Duplicate

## 2023-11-17 NOTE — Telephone Encounter (Unsigned)
 Copied from CRM [id number]. Topic: General - Other >> Nov 17, 2023 12:17 PM Joesph B wrote: Reason for CRM: patient calling to speak to priscilla.

## 2023-11-18 ENCOUNTER — Other Ambulatory Visit: Payer: Self-pay

## 2024-02-18 ENCOUNTER — Ambulatory Visit: Attending: Nurse Practitioner | Admitting: Nurse Practitioner

## 2024-02-18 ENCOUNTER — Encounter: Payer: Self-pay | Admitting: Nurse Practitioner

## 2024-02-18 VITALS — BP 128/75 | HR 58 | Ht 69.0 in | Wt 200.0 lb

## 2024-02-18 DIAGNOSIS — I10 Essential (primary) hypertension: Secondary | ICD-10-CM

## 2024-02-18 DIAGNOSIS — J0100 Acute maxillary sinusitis, unspecified: Secondary | ICD-10-CM

## 2024-02-18 DIAGNOSIS — R7989 Other specified abnormal findings of blood chemistry: Secondary | ICD-10-CM | POA: Diagnosis not present

## 2024-02-18 DIAGNOSIS — J301 Allergic rhinitis due to pollen: Secondary | ICD-10-CM

## 2024-02-18 DIAGNOSIS — E7800 Pure hypercholesterolemia, unspecified: Secondary | ICD-10-CM

## 2024-02-18 DIAGNOSIS — R928 Other abnormal and inconclusive findings on diagnostic imaging of breast: Secondary | ICD-10-CM | POA: Diagnosis not present

## 2024-02-18 DIAGNOSIS — Z23 Encounter for immunization: Secondary | ICD-10-CM

## 2024-02-18 MED ORDER — FLUTICASONE PROPIONATE 50 MCG/ACT NA SUSP: 2.0000 | Freq: Every day | NASAL | 6 refills | Status: AC

## 2024-02-18 NOTE — Progress Notes (Signed)
 "  Assessment & Plan:  [name] was seen today for hypertension.  Diagnoses and all orders for this visit:  Primary hypertension -     CMP14+EGFR Continue all antihypertensives as prescribed.  Reminded to bring in blood pressure log for follow  up appointment.  RECOMMENDATIONS: DASH/Mediterranean Diets are healthier choices for HTN.    Acute non-recurrent maxillary sinusitis -     fluticasone  (FLONASE ) 50 MCG/ACT nasal spray; Place 2 sprays into both nostrils daily. -     amoxicillin -clavulanate (AUGMENTIN ) 875-125 MG tablet; Take 1 tablet by mouth 2 (two) times daily for 7 days.  Abnormal mammogram of left breast Mammogram and US  pending  Abnormal CBC -     CBC with Differential  Hypercholesterolemia -     Lipid panel INSTRUCTIONS: Work on a low fat, heart healthy diet and participate in regular aerobic exercise program by working out at least 150 minutes per week; 5 days a week-30 minutes per day. Avoid red meat/beef/steak,  fried foods. junk foods, sodas, sugary drinks, unhealthy snacking, alcohol and smoking.  Drink at least 80 oz of water per day and monitor your carbohydrate intake daily.     Patient has been counseled on age-appropriate routine health concerns for screening and prevention. These are reviewed and up-to-date. Referrals have been placed accordingly. Immunizations are up-to-date or declined.    Subjective:   Chief Complaint  Patient presents with   Hypertension    Audrey Montgomery 64 y.o. female presents to office today for Hypertension  She has a past medical history of Asthma, Cataract, and Hypertension.   HTN Blood pressure is well controlled with amlodipine  and lisinopril .  BP Readings from Last 3 Encounters:  02/18/24 128/75  11/12/23 117/81  07/12/23 122/80     She experiences ongoing sinus issues characterized by a sensation of dryness, sinus pressure, occasional epistaxis, and the presence of long white stringy mucus. She uses Flonase   nasal spray as previously prescribed but suspects dryness might be contributing to her symptoms. No known allergies to dust, mold, or other environmental factors.  She experiences hot flashes and has not received the medication Veozah  prescribed for this condition. She is unsure if her insurance covers it and plans to follow up with the pharmacy.  She has a history of a benign left breast mass that requires monitoring. Mammogram scheduled for 03-14-2024  She has a colonoscopy scheduled for March and mentions issues with her insurance being incorrectly listed which has caused some confusion with her medical appointments.  She has quit smoking, which she believes has positively impacted her blood pressure. She engages in regular physical activity, including walking from her parking spot to her appointments.  Review of Systems  Constitutional:  Negative for fever, malaise/fatigue and weight loss.  HENT:  Positive for nosebleeds and sinus pain.   Eyes: Negative.  Negative for blurred vision, double vision and photophobia.  Respiratory: Negative.  Negative for cough and shortness of breath.   Cardiovascular: Negative.  Negative for chest pain, palpitations and leg swelling.  Musculoskeletal: Negative.  Negative for myalgias.  Neurological: Negative.  Negative for dizziness, focal weakness, seizures and headaches.  Psychiatric/Behavioral: Negative.  Negative for suicidal ideas.     Past Medical History:  Diagnosis Date   Asthma    Cataract    Both Eyes.   Hypertension     Past Surgical History:  Procedure Laterality Date   APPENDECTOMY     CATARACT EXTRACTION Bilateral    02/24   DILATATION &  CURETTAGE/HYSTEROSCOPY WITH MYOSURE N/A 03/15/2023   Procedure: DILATATION & CURETTAGE/HYSTEROSCOPY WITH MYOSURE;  Surgeon: Jeralyn Crutch, MD;  Location: [facility] OR;  Service: Gynecology;  Laterality: N/A;    Family History  Problem Relation Age of Onset   Stroke Brother    Peripheral vascular  disease Other        father's side   Neuropathy Neg Hx        that she knows of    Breast cancer Neg Hx     Social History Reviewed with no changes to be made today.   Outpatient Medications Prior to Visit  Medication Sig Dispense Refill   amLODipine  (NORVASC ) 10 MG tablet Take 1 tablet (10 mg total) by mouth daily. 90 tablet 1   atorvastatin  (LIPITOR) 20 MG tablet Take 1 tablet (20 mg total) by mouth daily. 90 tablet 3   Fezolinetant  (VEOZAH ) 45 MG TABS Take 1 tablet (45 mg total) by mouth daily. 90 tablet 1   gabapentin  (NEURONTIN ) 600 MG tablet TAKE 1/2 TO 1 (ONE-HALF TO ONE) TABLET BY MOUTH THREE TIMES DAILY 90 tablet 0   lisinopril  (ZESTRIL ) 20 MG tablet Take 1 tablet (20 mg total) by mouth daily. 90 tablet 1   VENTOLIN  HFA 108 (90 Base) MCG/ACT inhaler INHALE 2 PUFFS BY MOUTH EVERY 6 HOURS AS NEEDED FOR WHEEZING FOR SHORTNESS OF BREATH 18 g 2   fluticasone  (FLONASE ) 50 MCG/ACT nasal spray Place 2 sprays into both nostrils daily. 16 g 6   benzonatate  (TESSALON ) 200 MG capsule Take 1 capsule (200 mg total) by mouth 2 (two) times daily as needed for cough. (Patient not taking: Reported on 02/18/2024) 30 capsule 0   No facility-administered medications prior to visit.    Allergies[1]     Objective:    BP 128/75 (BP Location: Left Arm, Patient Position: Sitting, Cuff Size: Normal)   Pulse (!) 58   Ht 5' 9 (1.753 m)   Wt 200 lb (90.7 kg)   LMP  (LMP Unknown)   SpO2 100%   BMI 29.53 kg/m  Wt Readings from Last 3 Encounters:  02/18/24 200 lb (90.7 kg)  11/12/23 198 lb 3.2 oz (89.9 kg)  07/12/23 208 lb 9.6 oz (94.6 kg)    Physical Exam Vitals and nursing note reviewed.  Constitutional:      Appearance: She is well-developed.  HENT:     Head: Normocephalic and atraumatic.     Nose:     Right Turbinates: Enlarged, swollen and pale.     Left Turbinates: Enlarged, swollen and pale.  Cardiovascular:     Rate and Rhythm: Normal rate and regular rhythm.     Heart sounds:  Normal heart sounds. No murmur heard.    No friction rub. No gallop.  Pulmonary:     Effort: Pulmonary effort is normal. No tachypnea or respiratory distress.     Breath sounds: Normal breath sounds. No decreased breath sounds, wheezing, rhonchi or rales.  Chest:     Chest wall: No tenderness.  Musculoskeletal:        General: Normal range of motion.     Cervical back: Normal range of motion.  Skin:    General: Skin is warm and dry.  Neurological:     Mental Status: She is alert and oriented to person, place, and time.     Coordination: Coordination normal.  Psychiatric:        Behavior: Behavior normal. Behavior is cooperative.        Thought Content: Thought  content normal.        Judgment: Judgment normal.          Patient has been counseled extensively about nutrition and exercise as well as the importance of adherence with medications and regular follow-up. The patient was given clear instructions to go to ER or return to medical center if symptoms don't improve, worsen or new problems develop. The patient verbalized understanding.   Follow-up: Return in about 4 months (around 06/17/2024).   Haze LELON Servant, FNP-BC [facility] [place], [place] [phone]   03/05/2024, 11:55 PM     [1] No Known Allergies  "

## 2024-02-19 LAB — CMP14+EGFR
ALT: 13 IU/L (ref 0–32)
AST: 15 IU/L (ref 0–40)
Albumin: 4.7 g/dL (ref 3.9–4.9)
Alkaline Phosphatase: 134 IU/L (ref 49–135)
BUN/Creatinine Ratio: 10 — ABNORMAL LOW (ref 12–28)
BUN: 12 mg/dL (ref 8–27)
Bilirubin Total: 0.3 mg/dL (ref 0.0–1.2)
CO2: 23 mmol/L (ref 20–29)
Calcium: 10 mg/dL (ref 8.7–10.3)
Chloride: 107 mmol/L — ABNORMAL HIGH (ref 96–106)
Creatinine, Ser: 1.2 mg/dL — ABNORMAL HIGH (ref 0.57–1.00)
Globulin, Total: 2.3 g/dL (ref 1.5–4.5)
Glucose: 85 mg/dL (ref 70–99)
Potassium: 4.5 mmol/L (ref 3.5–5.2)
Sodium: 144 mmol/L (ref 134–144)
Total Protein: 7 g/dL (ref 6.0–8.5)
eGFR: 51 mL/min/1.73 — ABNORMAL LOW

## 2024-02-19 LAB — LIPID PANEL
Chol/HDL Ratio: 4.2 ratio (ref 0.0–4.4)
Cholesterol, Total: 208 mg/dL — ABNORMAL HIGH (ref 100–199)
HDL: 50 mg/dL
LDL Chol Calc (NIH): 136 mg/dL — ABNORMAL HIGH (ref 0–99)
Triglycerides: 122 mg/dL (ref 0–149)
VLDL Cholesterol Cal: 22 mg/dL (ref 5–40)

## 2024-02-19 LAB — CBC WITH DIFFERENTIAL/PLATELET
Basophils Absolute: 0 x10E3/uL (ref 0.0–0.2)
Basos: 0 %
EOS (ABSOLUTE): 0.1 x10E3/uL (ref 0.0–0.4)
Eos: 1 %
Hematocrit: 42.9 % (ref 34.0–46.6)
Hemoglobin: 14.1 g/dL (ref 11.1–15.9)
Immature Grans (Abs): 0 x10E3/uL (ref 0.0–0.1)
Immature Granulocytes: 0 %
Lymphocytes Absolute: 6.4 x10E3/uL — ABNORMAL HIGH (ref 0.7–3.1)
Lymphs: 58 %
MCH: 32 pg (ref 26.6–33.0)
MCHC: 32.9 g/dL (ref 31.5–35.7)
MCV: 98 fL — ABNORMAL HIGH (ref 79–97)
Monocytes Absolute: 0.6 x10E3/uL (ref 0.1–0.9)
Monocytes: 5 %
Neutrophils Absolute: 4.1 x10E3/uL (ref 1.4–7.0)
Neutrophils: 36 %
Platelets: 269 x10E3/uL (ref 150–450)
RBC: 4.4 x10E6/uL (ref 3.77–5.28)
RDW: 13.2 % (ref 11.7–15.4)
WBC: 11.1 x10E3/uL — ABNORMAL HIGH (ref 3.4–10.8)

## 2024-02-21 ENCOUNTER — Other Ambulatory Visit: Payer: Self-pay | Admitting: Nurse Practitioner

## 2024-02-21 DIAGNOSIS — N6320 Unspecified lump in the left breast, unspecified quadrant: Secondary | ICD-10-CM

## 2024-02-22 ENCOUNTER — Ambulatory Visit: Payer: Self-pay | Admitting: Nurse Practitioner

## 2024-03-05 ENCOUNTER — Encounter: Payer: Self-pay | Admitting: Nurse Practitioner

## 2024-03-05 MED ORDER — AMOXICILLIN-POT CLAVULANATE 875-125 MG PO TABS: 1.0000 | ORAL_TABLET | Freq: Two times a day (BID) | ORAL | 0 refills | Status: AC

## 2024-03-14 ENCOUNTER — Ambulatory Visit
Admission: RE | Admit: 2024-03-14 | Discharge: 2024-03-14 | Disposition: A | Source: Ambulatory Visit | Attending: Nurse Practitioner

## 2024-03-14 DIAGNOSIS — N6320 Unspecified lump in the left breast, unspecified quadrant: Secondary | ICD-10-CM

## 2024-06-19 ENCOUNTER — Ambulatory Visit: Payer: Self-pay | Admitting: Nurse Practitioner
